# Patient Record
Sex: Female | Born: 1940 | Race: White | Hispanic: No | Marital: Married | Smoking: Never smoker
Health system: Southern US, Community
[De-identification: ages and names within clinical notes are randomized; demographics above are authoritative.]

## PROBLEM LIST (undated history)

## (undated) DIAGNOSIS — D509 Iron deficiency anemia, unspecified: Secondary | ICD-10-CM

## (undated) DIAGNOSIS — D649 Anemia, unspecified: Secondary | ICD-10-CM

## (undated) DIAGNOSIS — E785 Hyperlipidemia, unspecified: Secondary | ICD-10-CM

## (undated) DIAGNOSIS — I10 Essential (primary) hypertension: Secondary | ICD-10-CM

## (undated) DIAGNOSIS — H3530 Unspecified macular degeneration: Secondary | ICD-10-CM

## (undated) HISTORY — DX: Essential (primary) hypertension: I10

## (undated) HISTORY — PX: ABDOMINAL HYSTERECTOMY: SHX81

## (undated) HISTORY — DX: Hyperlipidemia, unspecified: E78.5

## (undated) HISTORY — DX: Anemia, unspecified: D64.9

## (undated) HISTORY — DX: Iron deficiency anemia, unspecified: D50.9

---

## 2016-05-07 DIAGNOSIS — H2513 Age-related nuclear cataract, bilateral: Secondary | ICD-10-CM | POA: Diagnosis not present

## 2016-05-07 DIAGNOSIS — H43811 Vitreous degeneration, right eye: Secondary | ICD-10-CM | POA: Diagnosis not present

## 2016-05-07 DIAGNOSIS — H353132 Nonexudative age-related macular degeneration, bilateral, intermediate dry stage: Secondary | ICD-10-CM | POA: Diagnosis not present

## 2016-06-21 DIAGNOSIS — H353190 Nonexudative age-related macular degeneration, unspecified eye, stage unspecified: Secondary | ICD-10-CM | POA: Diagnosis not present

## 2016-06-21 DIAGNOSIS — H35721 Serous detachment of retinal pigment epithelium, right eye: Secondary | ICD-10-CM | POA: Diagnosis not present

## 2016-06-21 DIAGNOSIS — H353132 Nonexudative age-related macular degeneration, bilateral, intermediate dry stage: Secondary | ICD-10-CM | POA: Diagnosis not present

## 2016-06-21 DIAGNOSIS — H2513 Age-related nuclear cataract, bilateral: Secondary | ICD-10-CM | POA: Diagnosis not present

## 2016-06-21 DIAGNOSIS — H43811 Vitreous degeneration, right eye: Secondary | ICD-10-CM | POA: Diagnosis not present

## 2016-07-31 DIAGNOSIS — J0100 Acute maxillary sinusitis, unspecified: Secondary | ICD-10-CM | POA: Diagnosis not present

## 2016-09-05 ENCOUNTER — Emergency Department (HOSPITAL_BASED_OUTPATIENT_CLINIC_OR_DEPARTMENT_OTHER)
Admission: EM | Admit: 2016-09-05 | Discharge: 2016-09-05 | Disposition: A | Discharge status: Home / Self Care | Payer: PPO | Attending: Emergency Medicine | Admitting: Emergency Medicine

## 2016-09-05 ENCOUNTER — Encounter (HOSPITAL_BASED_OUTPATIENT_CLINIC_OR_DEPARTMENT_OTHER): Payer: Self-pay | Admitting: *Deleted

## 2016-09-05 ENCOUNTER — Emergency Department (HOSPITAL_BASED_OUTPATIENT_CLINIC_OR_DEPARTMENT_OTHER): Payer: PPO

## 2016-09-05 DIAGNOSIS — R0602 Shortness of breath: Secondary | ICD-10-CM | POA: Diagnosis not present

## 2016-09-05 DIAGNOSIS — R0600 Dyspnea, unspecified: Secondary | ICD-10-CM

## 2016-09-05 DIAGNOSIS — I10 Essential (primary) hypertension: Secondary | ICD-10-CM | POA: Insufficient documentation

## 2016-09-05 DIAGNOSIS — R0789 Other chest pain: Secondary | ICD-10-CM | POA: Diagnosis not present

## 2016-09-05 DIAGNOSIS — D508 Other iron deficiency anemias: Secondary | ICD-10-CM | POA: Insufficient documentation

## 2016-09-05 DIAGNOSIS — D509 Iron deficiency anemia, unspecified: Secondary | ICD-10-CM | POA: Diagnosis not present

## 2016-09-05 HISTORY — DX: Unspecified macular degeneration: H35.30

## 2016-09-05 LAB — BASIC METABOLIC PANEL
Anion gap: 7 (ref 5–15)
BUN: 12 mg/dL (ref 6–20)
CALCIUM: 9.4 mg/dL (ref 8.9–10.3)
CO2: 28 mmol/L (ref 22–32)
Chloride: 104 mmol/L (ref 101–111)
Creatinine, Ser: 0.81 mg/dL (ref 0.44–1.00)
GFR calc Af Amer: >60 mL/min (ref >60)
GLUCOSE: 113 mg/dL — AB (ref 65–99)
POTASSIUM: 3.8 mmol/L (ref 3.5–5.1)
Sodium: 139 mmol/L (ref 135–145)

## 2016-09-05 LAB — CBC
HEMATOCRIT: 32.2 % — AB (ref 36.0–46.0)
Hemoglobin: 9.8 g/dL — ABNORMAL LOW (ref 12.0–15.0)
MCH: 21.4 pg — AB (ref 26.0–34.0)
MCHC: 30.4 g/dL (ref 30.0–36.0)
MCV: 70.3 fL — AB (ref 78.0–100.0)
PLATELETS: 290 10*3/uL (ref 150–400)
RBC: 4.58 MIL/uL (ref 3.87–5.11)
RDW: 18.7 % — AB (ref 11.5–15.5)
WBC: 5.2 10*3/uL (ref 4.0–10.5)

## 2016-09-05 LAB — TROPONIN I: Troponin I: <0.03 ng/mL (ref <0.03)

## 2016-09-05 NOTE — ED Triage Notes (Signed)
C/o chest tightness since yesterday. Also states she has been more out of breath than usual when she "runs up the stairs". States has not seen a doctor in years but last month was told her b/p was elevated when she went to an Urgent care for ear problems. States since then she has been checking her blood pressure at home and in the morning "it's high". Pt reports being very active

## 2016-09-05 NOTE — Discharge Instructions (Signed)
It was our pleasure to provide your ER care today - we hope that you feel better.  For shortness of breath/tightness, follow up with cardiologist in the coming week - see referral, or contact cardiology group of your preference, in the next day, to arrange follow up appointment.   For high blood pressure, and low blood count (hemoglobin 9.8) - follow up with primary care doctor in the coming week - discuss possibly starting blood pressure medication and iron supplement then.   Return to ER right away if worse, recurrent or persistent chest pain, increased difficulty breathing, other concern.

## 2016-09-05 NOTE — ED Provider Notes (Signed)
[facility] Provider Note   CSN: [id number] Arrival date & time: 09/05/16  [id number]     History   Chief Complaint Chief Complaint  Patient presents with  . Chest Pain    HPI Patricia Flowers is a 75 y.o. female.  Patient c/o sob, and mild chest tightness when running up the stairs in the past couple weeks. Symptoms will persist for 2-3 minutes, and resolve w rest. Denies associated nv, diaphoresis. Denies cough or uri c/o. No fever or chills. No actual chest pain, but states more of a feeling of transient sob.  Denies neck or arm pain. No leg pain or swelling. No pleuritic pain. No recent surgery, immobility, trauma or prolonged travel. No hx dvt or pe. No hx cad. +mother had cad in her 27s. Remote hx neg stress test, but no recent cardiac eval. No recent wt loss or wt gain. No hx anemia. No recent blood loss. Saw pcp last month and was told bp high - has been monitoring at home with most bps in 140's, although higher this AM.     The history is provided by the patient.  Chest Pain   Associated symptoms include shortness of breath. Pertinent negatives include no abdominal pain, no back pain, no fever, no headaches, no nausea, no palpitations and no vomiting.    Past Medical History:  Diagnosis Date  . Macular degeneration     There are no active problems to display for this patient.   Past Surgical History:  Procedure Laterality Date  . ABDOMINAL HYSTERECTOMY      OB History    No data available       Home Medications    Prior to Admission medications   Not on File    Family History No family history on file.  Social History Social History  Substance Use Topics  . Smoking status: Never Smoker  . Smokeless tobacco: Never Used  . Alcohol use No     Allergies   Shellfish allergy   Review of Systems Review of Systems  Constitutional: Negative for fever.  HENT: Negative for sore throat.   Eyes: Negative for redness.  Respiratory: Positive for  shortness of breath.   Cardiovascular: Negative for palpitations and leg swelling.  Gastrointestinal: Negative for abdominal pain, nausea and vomiting.  Genitourinary: Negative for flank pain.  Musculoskeletal: Negative for back pain and neck pain.  Skin: Negative for rash.  Neurological: Negative for headaches.  Hematological: Does not bruise/bleed easily.  Psychiatric/Behavioral: Negative for confusion.     Physical Exam Updated Vital Signs BP 176/87   Pulse 83   Resp 17   SpO2 98%   Physical Exam  Constitutional: She appears well-developed and well-nourished. No distress.  HENT:  Mouth/Throat: Oropharynx is clear and moist.  Eyes: Conjunctivae are normal. No scleral icterus.  Neck: Neck supple. No tracheal deviation present.  Cardiovascular: Normal rate, regular rhythm, normal heart sounds and intact distal pulses.  Exam reveals no gallop and no friction rub.   No murmur heard. Pulmonary/Chest: Effort normal and breath sounds normal. No respiratory distress.  Abdominal: Soft. Normal appearance. She exhibits no distension. There is no tenderness.  Musculoskeletal: She exhibits no edema.  Neurological: She is alert.  Skin: Skin is warm and dry. No rash noted. She is not diaphoretic.  Psychiatric: She has a normal mood and affect.  Nursing note and vitals reviewed.    ED Treatments / Results  Labs (all labs ordered are listed, but only abnormal results  are displayed) Results for orders placed or performed during the hospital encounter of 09/05/16  CBC  Result Value Ref Range   WBC 5.2 4.0 - 10.5 K/uL   RBC 4.58 3.87 - 5.11 MIL/uL   Hemoglobin 9.8 (L) 12.0 - 15.0 g/dL   HCT 32.2 (L) 36.0 - 46.0 %   MCV 70.3 (L) 78.0 - 100.0 fL   MCH 21.4 (L) 26.0 - 34.0 pg   MCHC 30.4 30.0 - 36.0 g/dL   RDW 18.7 (H) 11.5 - 15.5 %   Platelets 290 150 - 400 K/uL  Basic metabolic panel  Result Value Ref Range   Sodium 139 135 - 145 mmol/L   Potassium 3.8 3.5 - 5.1 mmol/L   Chloride  104 101 - 111 mmol/L   CO2 28 22 - 32 mmol/L   Glucose, Bld 113 (H) 65 - 99 mg/dL   BUN 12 6 - 20 mg/dL   Creatinine, Ser 0.81 0.44 - 1.00 mg/dL   Calcium 9.4 8.9 - 10.3 mg/dL   GFR calc non Af Amer >60 >60 mL/min   GFR calc Af Amer >60 >60 mL/min   Anion gap 7 5 - 15  Troponin I  Result Value Ref Range   Troponin I <0.03 <0.03 ng/mL   Dg Chest 2 View  Result Date: 09/05/2016 CLINICAL DATA:  Shortness of breath, intermittent for 2 weeks EXAM: CHEST  2 VIEW COMPARISON:  None. FINDINGS: Lungs are clear. Heart size and pulmonary vascularity are normal. No adenopathy. There are calcified breast implants bilaterally. 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 IMPRESSION: No edema or consolidation. Electronically Signed   By: Lowella Grip III M.D.   On: 09/05/2016 09:40    EKG  EKG Interpretation  Date/Time:  Wednesday September 05 2016 08:33:54 EST Ventricular Rate:  87 PR Interval:    QRS Duration: 100 QT Interval:  395 QTC Calculation: 476 R Axis:   62 Text Interpretation:  Sinus rhythm Baseline wander in lead(s) II III aVF V2 V5 V6 No previous tracing Confirmed by Ashok Cordia  MD, Lennette Bihari ([id number]) on 09/05/2016 8:50:21 AM       Radiology Dg Chest 2 View  Result Date: 09/05/2016 CLINICAL DATA:  Shortness of breath, intermittent for 2 weeks EXAM: CHEST  2 VIEW COMPARISON:  None. FINDINGS: Lungs are clear. Heart size and pulmonary vascularity are normal. No adenopathy. There are calcified breast implants bilaterally. 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 IMPRESSION: No edema or consolidation. Electronically Signed   By: Lowella Grip III M.D.   On: 09/05/2016 09:40    Procedures Procedures (including critical care time)  Medications Ordered in ED Medications - No data to display   Initial Impression / Assessment and Plan / ED Course  I have reviewed the triage vital signs and the nursing notes.  Pertinent labs & imaging results that were available during my care of the patient  were reviewed by me and considered in my medical decision making (see chart for details).  Clinical Course    Ecg. Labs. Cxr.  Reviewed nursing notes and prior charts for additional history.   Recheck pt, completely asymptomatic. No chest pain or discomfort. No sob.  Discussed labs, including microcytic anemia w pt - no recent blood loss, no melena - will f/u pcp, discussed possible fe rx.   For transient dyspnea/tightness, and fam hx cad, will have f/u cardiology as outpatient.   Pt remains asymptomatic, and currently appears stable for d/c.    Final Clinical Impressions(s) / ED Diagnoses  Final diagnoses:  None    New Prescriptions New Prescriptions   No medications on file     Lajean Saver, MD 09/05/16 1055

## 2016-09-05 NOTE — ED Notes (Signed)
Patient transported to X-ray 

## 2016-09-12 ENCOUNTER — Telehealth: Payer: Self-pay | Admitting: Behavioral Health

## 2016-09-12 ENCOUNTER — Encounter: Payer: Self-pay | Admitting: Behavioral Health

## 2016-09-12 NOTE — Telephone Encounter (Signed)
Pre-Visit Call completed with patient and chart updated.   Pre-Visit Info documented in Specialty Comments under [employer].    

## 2016-09-13 ENCOUNTER — Ambulatory Visit (INDEPENDENT_AMBULATORY_CARE_PROVIDER_SITE_OTHER): Payer: PPO | Admitting: Family Medicine

## 2016-09-13 ENCOUNTER — Encounter: Payer: Self-pay | Admitting: Family Medicine

## 2016-09-13 VITALS — BP 154/80 | HR 97 | Temp 98.9°F | Ht 62.0 in | Wt 123.0 lb

## 2016-09-13 DIAGNOSIS — I10 Essential (primary) hypertension: Secondary | ICD-10-CM | POA: Diagnosis not present

## 2016-09-13 DIAGNOSIS — D509 Iron deficiency anemia, unspecified: Secondary | ICD-10-CM | POA: Diagnosis not present

## 2016-09-13 DIAGNOSIS — Z13220 Encounter for screening for lipoid disorders: Secondary | ICD-10-CM | POA: Diagnosis not present

## 2016-09-13 DIAGNOSIS — Z1231 Encounter for screening mammogram for malignant neoplasm of breast: Secondary | ICD-10-CM

## 2016-09-13 DIAGNOSIS — Z1239 Encounter for other screening for malignant neoplasm of breast: Secondary | ICD-10-CM

## 2016-09-13 LAB — LIPID PANEL
CHOLESTEROL: 210 mg/dL — AB (ref 0–200)
HDL: 78.4 mg/dL (ref >39.00)
LDL CALC: 112 mg/dL — AB (ref 0–99)
NONHDL: 132.05
Total CHOL/HDL Ratio: 3
Triglycerides: 100 mg/dL (ref 0.0–149.0)
VLDL: 20 mg/dL (ref 0.0–40.0)

## 2016-09-13 LAB — FERRITIN: Ferritin: 7.2 ng/mL — ABNORMAL LOW (ref 10.0–291.0)

## 2016-09-13 LAB — CBC
MCHC: 30.9 g/dL (ref 30.0–36.0)
MCV: 69.7 fl — ABNORMAL LOW (ref 78.0–100.0)
Platelets: 314 10*3/uL (ref 150.0–400.0)
RBC: 4.45 Mil/uL (ref 3.87–5.11)
RDW: 19.2 % — ABNORMAL HIGH (ref 11.5–15.5)
WBC: 5.5 10*3/uL (ref 4.0–10.5)

## 2016-09-13 LAB — IBC PANEL
Iron: 13 ug/dL — ABNORMAL LOW (ref 42–145)
Saturation Ratios: 3 % — ABNORMAL LOW (ref 20.0–50.0)
Transferrin: 311 mg/dL (ref 212.0–360.0)

## 2016-09-13 NOTE — Progress Notes (Signed)
Chief Complaint  Patient presents with  . Establish Care       New Patient Visit SUBJECTIVE: HPI: Patricia Flowers is an 75 y.o.female who is being seen for establishing care.  Hypertension Patient presents for hypertension follow up. She does monitor home blood pressures. Blood pressures ranging on average from 150's/80's. She is not on any medications. She is adhering to a healthy diet overall. Exercise: walking, pickleball, [facility]  Anemia Was told her blood count is low in the ED on 11/29. New to her. Denies areas of easy bleeding, she does bruise easier as she gets older, no blood in stool or dark tarry stools. She is not on ASA or NSAIDs routinely. Not on any anti-coagulation. Her last colonoscopy was 8 years ago and they removed polyps. Per her, she is to follow up after 10 years.    Allergies  Allergen Reactions  . Shellfish Allergy Swelling    Past Medical History:  Diagnosis Date  . Anemia   . Hyperlipidemia   . Hypertension   . Macular degeneration    Past Surgical History:  Procedure Laterality Date  . ABDOMINAL HYSTERECTOMY     Social History   Social History  . Marital status: Married   Social History Main Topics  . Smoking status: Never Smoker  . Smokeless tobacco: Never Used  . Alcohol use No  . Drug use: No   Family History  Problem Relation Age of Onset  . Heart disease Mother      Current Outpatient Prescriptions:  .  Ascorbic Acid (VITAMIN C) 1000 MG tablet, Take 1,000 mg by mouth daily., Disp: , Rfl:  .  Biotin 10000 MCG TABS, Take by mouth., Disp: , Rfl:  .  Coenzyme Q10 (COQ-10) 100 MG CAPS, Take by mouth., Disp: , Rfl:  .  folic acid (FOLVITE) Q000111Q MCG tablet, Take 400 mcg by mouth daily., Disp: , Rfl:  .  Krill Oil (MAXIMUM RED KRILL PO), Take 500 mg by mouth., Disp: , Rfl:  .  TURMERIC PO, Take 600 mg by mouth., Disp: , Rfl:  .  vitamin B-12 (CYANOCOBALAMIN) 1000 MCG tablet, Take 1,000 mcg by mouth 3 (three) times a  week., Disp: , Rfl:  .  vitamin E 1000 UNIT capsule, Take 1,000 Units by mouth every other day., Disp: , Rfl:   No LMP recorded. Patient has had a hysterectomy.  ROS Cardiovascular: Denies chest pain  Respiratory: Denies dyspnea   OBJECTIVE: BP (!) 154/80   Pulse 97   Temp 98.9 F (37.2 C) (Oral)   Ht 5\' 2"  (1.575 m)   Wt 123 lb (55.8 kg)   SpO2 100%   BMI 22.50 kg/m   Constitutional: -  VS reviewed -  Well developed, well nourished, appears stated age -  No apparent distress  Psychiatric: -  Oriented to person, place, and time -  Memory intact -  Affect and mood normal -  Fluent conversation, good eye contact -  Judgment and insight age appropriate  Eye: -  Conjunctivae clear, no discharge -  Pupils symmetric, round, reactive to light  ENMT: -  Ears are patent b/l without erythema or discharge. TM's are shiny and clear b/l without evidence of effusion or infection. -  Oral mucosa without lesions, tongue and uvula midline    Tonsils not enlarged, no erythema, no exudate, trachea midline    Pharynx moist, no lesions, no erythema  Neck: -  No gross swelling, no palpable masses -  Thyroid midline, not enlarged, mobile, no palpable masses  Cardiovascular: -  RRR, no murmurs -  No LE edema  Respiratory: -  Normal respiratory effort, no accessory muscle use, no retraction -  Breath sounds equal, no wheezes, no ronchi, no crackles  Gastrointestinal: -  Bowel sounds normal -  No tenderness, no distention, no guarding, no masses  Skin: -  No significant lesion on inspection -  Warm and dry to palpation   ASSESSMENT/PLAN: Microcytic anemia - Plan: Save smear, IBC panel, Ferritin, CBC  Essential hypertension  Breast cancer screening - Plan: MM DIGITAL SCREENING BILATERAL  Screening cholesterol level - Plan: Lipid panel  Orders as above. OK with seeing if she can lower it on her own in the next month. Recommended tetanus shot today, but she declined stating that she will  think about it. Patient should return pending the above workup. If it is Fe def anemia, I will refer her to GI after placing her on a supplement.  The patient voiced understanding and agreement to the plan.   [place], DO 09/13/16  11:00 AM

## 2016-09-13 NOTE — Progress Notes (Signed)
Pre visit review using our clinic tool,if applicable. No additional management support is needed unless otherwise documented below in the visit note.  

## 2016-09-13 NOTE — Patient Instructions (Signed)
Someone will call you to schedule your next appointment based on your lab results.  Someone will call you to set up your mammogram.

## 2016-09-17 ENCOUNTER — Ambulatory Visit (HOSPITAL_BASED_OUTPATIENT_CLINIC_OR_DEPARTMENT_OTHER)
Admission: RE | Admit: 2016-09-17 | Discharge: 2016-09-17 | Disposition: A | Discharge status: Home / Self Care | Payer: PPO | Source: Ambulatory Visit | Attending: Family Medicine | Admitting: Family Medicine

## 2016-09-17 ENCOUNTER — Ambulatory Visit (INDEPENDENT_AMBULATORY_CARE_PROVIDER_SITE_OTHER): Payer: PPO | Admitting: Family Medicine

## 2016-09-17 ENCOUNTER — Encounter: Payer: Self-pay | Admitting: Family Medicine

## 2016-09-17 ENCOUNTER — Encounter: Payer: Self-pay | Admitting: Physician Assistant

## 2016-09-17 VITALS — BP 152/68 | HR 104 | Temp 98.0°F | Ht 62.0 in | Wt 123.0 lb

## 2016-09-17 DIAGNOSIS — Z9189 Other specified personal risk factors, not elsewhere classified: Secondary | ICD-10-CM | POA: Diagnosis not present

## 2016-09-17 DIAGNOSIS — Z1231 Encounter for screening mammogram for malignant neoplasm of breast: Secondary | ICD-10-CM | POA: Insufficient documentation

## 2016-09-17 DIAGNOSIS — Z1239 Encounter for other screening for malignant neoplasm of breast: Secondary | ICD-10-CM

## 2016-09-17 DIAGNOSIS — D509 Iron deficiency anemia, unspecified: Secondary | ICD-10-CM

## 2016-09-17 NOTE — Patient Instructions (Signed)
Take Ferrous Sulfate over the counter. Three times daily. It may turn your stool black. It may also cause constipation- some have success with Colace to offset this.

## 2016-09-17 NOTE — Progress Notes (Signed)
Pre visit review using our clinic review tool, if applicable. No additional management support is needed unless otherwise documented below in the visit note. 

## 2016-09-17 NOTE — Progress Notes (Signed)
Chief Complaint  Patient presents with  . Follow-up    labs    Subjective: Patient is a 75 y.o. female here for lab f/u.  10 yr CVD is >10%. She exercises routinely and has a healthy diet. Her BP is not well controlled, but she is trying to manage it using OTC supplements and vitamins. She is not currently on a statin.  Iron deficiency anemia The patient's iron deficiency anemia has been confirmed. She notes that she has been having hair loss, a proclivity to eat ice, and intermittent shortness of breath when she exerts herself. Does not take an iron supplement. She denies any areas of easy bruising or bleeding. No dark or tarry stools, no blood in the stool, no blood in the urine, and no yellowing of the skin or eyes.  ROS: Heme: No areas of easy bruising or bleeding GI: No constipation or diarrhea, no dark tarry stools or bleeding  Family History  Problem Relation Age of Onset  . Heart disease Mother    Past Medical History:  Diagnosis Date  . Anemia   . Hyperlipidemia   . Hypertension   . Macular degeneration    Allergies  Allergen Reactions  . Iodine Itching and Swelling  . Shellfish Allergy Swelling    Current Outpatient Prescriptions:  .  Ascorbic Acid (VITAMIN C) 1000 MG tablet, Take 1,000 mg by mouth daily., Disp: , Rfl:  .  Biotin 10000 MCG TABS, Take by mouth., Disp: , Rfl:  .  Coenzyme Q10 (COQ-10) 100 MG CAPS, Take by mouth., Disp: , Rfl:  .  folic acid (FOLVITE) Q000111Q MCG tablet, Take 400 mcg by mouth daily., Disp: , Rfl:  .  Krill Oil (MAXIMUM RED KRILL PO), Take 500 mg by mouth., Disp: , Rfl:  .  TURMERIC PO, Take 600 mg by mouth., Disp: , Rfl:  .  vitamin B-12 (CYANOCOBALAMIN) 1000 MCG tablet, Take 1,000 mcg by mouth 3 (three) times a week., Disp: , Rfl:  .  vitamin E 1000 UNIT capsule, Take 1,000 Units by mouth every other day., Disp: , Rfl:   Objective: BP (!) 152/68 (BP Location: Left Arm, Patient Position: Sitting, Cuff Size: Small)   Pulse (!) 104    Temp 98 F (36.7 C) (Oral)   Ht 5\' 2"  (1.575 m)   Wt 123 lb (55.8 kg)   SpO2 98%   BMI 22.50 kg/m  General: Awake, appears stated age HEENT: MMM, no subglossal icterus, no scleral icterus, PERRLA Neck: Supple, no masses, no thyromegaly Heart: RRR, no murmurs, no bruits Lungs: CTAB, no rales, wheezes or rhonchi. No accessory muscle use Abd: BS+, soft, NT, ND, no masses or organomegaly Psych: Age appropriate judgment and insight, normal affect and mood  Assessment and Plan: Iron deficiency anemia, unspecified iron deficiency anemia type - Plan: Ambulatory referral to Gastroenterology  10 year risk of MI or stroke 7.5% or greater  Orders as above. Start taking supplemental iron 3 times daily. Can use Colace or MiraLAX to help offset constipation. She is warned that it will turn her stools black. GI to rule out sites of bleeding. If this is unremarkable, will obtain a urine sample. She does not wish to start anything for her increased 10 years CVD risk. I would like her to try to get her blood pressure under control. I will recheck this in 4 weeks. If still elevated, will recommend starting treatment. The patient voiced understanding and agreement to the plan.  [place], DO 09/17/16  1:35 PM

## 2016-09-24 ENCOUNTER — Ambulatory Visit (INDEPENDENT_AMBULATORY_CARE_PROVIDER_SITE_OTHER): Payer: PPO | Admitting: Physician Assistant

## 2016-09-24 ENCOUNTER — Encounter: Payer: Self-pay | Admitting: Physician Assistant

## 2016-09-24 VITALS — BP 160/80 | HR 88 | Ht 62.0 in | Wt 122.8 lb

## 2016-09-24 DIAGNOSIS — K5909 Other constipation: Secondary | ICD-10-CM

## 2016-09-24 DIAGNOSIS — D509 Iron deficiency anemia, unspecified: Secondary | ICD-10-CM | POA: Diagnosis not present

## 2016-09-24 MED ORDER — NA SULFATE-K SULFATE-MG SULF 17.5-3.13-1.6 GM/177ML PO SOLN: 1.0000 | ORAL | 0 refills | Status: AC

## 2016-09-24 NOTE — Progress Notes (Signed)
Agree with assessment and plan. She should avoid all NSAIDs in the interim, would add PPI if she has any upper tract symptoms - does not endorse this per note.

## 2016-09-24 NOTE — Patient Instructions (Signed)

## 2016-09-24 NOTE — Progress Notes (Signed)
Chief Complaint: IDA  HPI:  Patricia Flowers is a 75 year old Caucasian female with a past medical history of hyperlipidemia and hypertension, who was referred to me by Shelda Pal* for a complaint of iron deficiency anemia. Patient had a recent CBC done in 09/05/16 with a hemoglobin of 9.8 and MCV low at 70.3. Iron panel on 09/13/2016 showed an iron low at 13 and percent saturation low at 3%, ferritin was also low at 7.2. Repeat CBC on 09/13/2016 showed a hemoglobin of 9.6.   Today, the patient tells me that she is generally well. She describes that she had not been to the doctor over the past 20 years until 2 months ago when she had loss of hearing in 1 ear, which turned out to be a cerumen impaction. At that time she was told she had high blood pressure and then ended up in the ER a couple of weeks later with chest pain that she experienced momentarily, cardiac workup was negative and she was told she needed a PCP. He was doing labs and discovered that her hemoglobin and iron were low as above. The patient tells me that she tries to avoid doctors and prescribed medications. She lives off of a lot of supplements which help her with her constipation. Patient does tell me that if she runs up the steps she will become short of breath, but only for a slight moment. She cannot tell me if this is a difference over the past couple of months. Patient has described that in general she feels somewhat more fatigued than normal, but blames this on her increased level of activity. Patient was started on iron supplements and feels better after starting them. She is leery to have any procedures done as she is now 75 years old.   Past medical history is positive for a colonoscopy approximately 12 years ago per the patient in [facility] where there is a finding of polyps. She never had another one.   She denies fever, chills, blood in her stool, melena, change in bowel habits, weight loss, anorexia, nausea,  vomiting, heartburn, reflux or abdominal pain.  Past Medical History:  Diagnosis Date  . Anemia   . Hyperlipidemia   . Hypertension   . Macular degeneration     Past Surgical History:  Procedure Laterality Date  . ABDOMINAL HYSTERECTOMY      Current Outpatient Prescriptions  Medication Sig Dispense Refill  . AMBULATORY NON FORMULARY MEDICATION NattoPlus , one capsule twice daily for blood pressure    . AMBULATORY NON FORMULARY MEDICATION Systolex tablets , one tablet twice daily for blood pressure    . AMBULATORY NON FORMULARY MEDICATION Takes [place] yoga and the following as well  Coconut oil, honey, cinnamon, vinegar, beet juice    . Ascorbic Acid (VITAMIN C) 1000 MG tablet Take 1,000 mg by mouth daily.    . Biotin 10000 MCG TABS Take by mouth.    . Coenzyme Q10 (COQ-10) 100 MG CAPS Take by mouth.    . ferrous sulfate 325 (65 FE) MG tablet Take 325 mg by mouth 3 (three) times daily with meals.    . folic acid (FOLVITE) 366 MCG tablet Take 400 mcg by mouth daily.    Javier Docker Oil (MAXIMUM RED KRILL PO) Take 500 mg by mouth.    . Multiple Vitamins-Minerals (PRESERVISION AREDS 2) CAPS Take 2 capsules by mouth daily.    . TURMERIC PO Take 600 mg by mouth.    . vitamin B-12 (  CYANOCOBALAMIN) 1000 MCG tablet Take 1,000 mcg by mouth 3 (three) times a week.    . vitamin E 1000 UNIT capsule Take 1,000 Units by mouth every other day.    . Na Sulfate-K Sulfate-Mg Sulf 17.5-3.13-1.6 GM/180ML SOLN Take 1 kit by mouth as directed. 354 mL 0   No current facility-administered medications for this visit.     Allergies as of 09/24/2016 - Review Complete 09/24/2016  Allergen Reaction Noted  . Iodine Itching and Swelling 03/20/2012  . Shellfish allergy Swelling 09/05/2016    Family History  Problem Relation Age of Onset  . Heart disease Mother   . Colon cancer Neg Hx     Social History   Social History  . Marital status: Married    Spouse name: N/A  . Number of children: 2  . Years  of education: N/A   Occupational History  . retired    Social History Main Topics  . Smoking status: Never Smoker  . Smokeless tobacco: Never Used  . Alcohol use No  . Drug use: No  . Sexual activity: Not on file   Other Topics Concern  . Not on file   Social History Narrative  . No narrative on file    Review of Systems:    Constitutional: Positive for fatigue No weight loss, fever, chills, weakness or fatigue HEENT: Eyes: No change in vision               Ears, Nose, Throat:  No change in hearing or congestion Cardiovascular: No chest pain Respiratory: Has referred DOB No cough Gastrointestinal: See HPI and otherwise negative Genitourinary: No dysuria  Neurological: No headache, dizziness or syncope Musculoskeletal: No new muscle or joint pain Hematologic: No bleeding or bruising Psychiatric: No history of depression or anxiety   Physical Exam:  Vital signs: BP (!) 160/80   Pulse 88   Ht 5' 2"  (1.575 m)   Wt 122 lb 12.8 oz (55.7 kg)   BMI 22.46 kg/m   Constitutional:   Pleasant Elderly Caucasian female appears to be in NAD, Well developed, Well nourished, alert and cooperative Head:  Normocephalic and atraumatic. Eyes:   PEERL, EOMI. No icterus. Conjunctiva pink. Ears:  Normal auditory acuity. Neck:  Supple Throat: Oral cavity and pharynx without inflammation, swelling or lesion.  Respiratory: Respirations even and unlabored. Lungs clear to auscultation bilaterally.   No wheezes, crackles, or rhonchi.  Cardiovascular: Normal S1, S2. No MRG. Regular rate and rhythm. No peripheral edema, cyanosis or pallor.  Gastrointestinal:  Soft, nondistended, nontender. No rebound or guarding. Normal bowel sounds. No appreciable masses or hepatomegaly. Rectal:  Not performed.  Msk:  Symmetrical without gross deformities. Without edema, no deformity or joint abnormality.  Neurologic:  Alert and  oriented x4;  grossly normal neurologically.  Skin:   Dry and intact without  significant lesions or rashes. Psychiatric:  Demonstrates good judgement and reason without abnormal affect or behaviors.  RELEVANT LABS AND IMAGING: CBC    Component Value Date/Time   WBC 5.5 09/13/2016 1102   RBC 4.45 09/13/2016 1102   HGB 9.6 Repeated and verified X2. (L) 09/13/2016 1102   HCT 31.0 Repeated and verified X2. (L) 09/13/2016 1102   PLT 314.0 09/13/2016 1102   MCV 69.7 Repeated and verified X2. (L) 09/13/2016 1102   MCH 21.4 (L) 09/05/2016 0850   MCHC 30.9 09/13/2016 1102   RDW 19.2 (H) 09/13/2016 1102    CMP     Component Value Date/Time   NA  139 09/05/2016 0850   K 3.8 09/05/2016 0850   CL 104 09/05/2016 0850   CO2 28 09/05/2016 0850   GLUCOSE 113 (H) 09/05/2016 0850   BUN 12 09/05/2016 0850   CREATININE 0.81 09/05/2016 0850   CALCIUM 9.4 09/05/2016 0850   GFRNONAA >60 09/05/2016 0850   GFRAA >60 09/05/2016 0850    Assessment: 1. IDA: Patient was found to be iron deficient in early November, continues to be anemic on iron supplementation, no GI symptoms; consider upper versus lower source of GI blood loss versus other 2. Constipation: Patient describes this is controlled with at home remedies of magnesium and coconut oil  Plan: 1. Recommend the patient proceed with an EGD and colonoscopy for further evaluation. This is scheduled with Dr. Havery Moros and [facility]. We did spend a long time discussing the risks and benefits of this procedure and the patient agrees to move forward. 2. Recommend the patient continue her iron supplementation as previously prescribed 3. Patient to follow in clinic per Dr. Doyne Keel recommendations after time procedures.  Ellouise Newer, PA-C [facility] 09/24/2016, 1:12 PM  Cc: Shelda Pal*

## 2016-10-03 ENCOUNTER — Telehealth: Payer: Self-pay | Admitting: Family Medicine

## 2016-10-03 NOTE — Telephone Encounter (Signed)
Relation to [id number] Call back number:[phone]   Reason for call:   Patient last seen 09/17/16 and states PCP advised as per [id number] Ferrous Sulfate patient states unsure if the OTC is causing patient to feel [place], loss appetite and weight loss, please advise

## 2016-10-03 NOTE — Telephone Encounter (Signed)
Please advise for recommendation. TL/CMA

## 2016-10-03 NOTE — Telephone Encounter (Signed)
OK to cut down on frequency or stop altogether and see if that helps.

## 2016-10-04 ENCOUNTER — Encounter: Payer: Self-pay | Admitting: Family Medicine

## 2016-10-04 ENCOUNTER — Telehealth: Payer: Self-pay | Admitting: Gastroenterology

## 2016-10-04 ENCOUNTER — Ambulatory Visit (INDEPENDENT_AMBULATORY_CARE_PROVIDER_SITE_OTHER): Payer: PPO | Admitting: Family Medicine

## 2016-10-04 VITALS — BP 149/79 | HR 97 | Temp 98.5°F | Ht 62.0 in | Wt 118.8 lb

## 2016-10-04 DIAGNOSIS — R634 Abnormal weight loss: Secondary | ICD-10-CM

## 2016-10-04 DIAGNOSIS — R110 Nausea: Secondary | ICD-10-CM

## 2016-10-04 DIAGNOSIS — D509 Iron deficiency anemia, unspecified: Secondary | ICD-10-CM

## 2016-10-04 DIAGNOSIS — Z888 Allergy status to other drugs, medicaments and biological substances status: Secondary | ICD-10-CM | POA: Diagnosis not present

## 2016-10-04 HISTORY — DX: Iron deficiency anemia, unspecified: D50.9

## 2016-10-04 LAB — CBC WITH DIFFERENTIAL/PLATELET
BASOS ABS: 0 10*3/uL (ref 0.0–0.1)
Basophils Relative: 0.1 % (ref 0.0–3.0)
EOS ABS: 0 10*3/uL (ref 0.0–0.7)
Eosinophils Relative: 0.6 % (ref 0.0–5.0)
HEMATOCRIT: 38.3 % (ref 36.0–46.0)
Hemoglobin: 12.3 g/dL (ref 12.0–15.0)
LYMPHS ABS: 1.2 10*3/uL (ref 0.7–4.0)
LYMPHS PCT: 19.4 % (ref 12.0–46.0)
MCHC: 32.1 g/dL (ref 30.0–36.0)
MCV: 76.2 fl — ABNORMAL LOW (ref 78.0–100.0)
Monocytes Absolute: 0.3 10*3/uL (ref 0.1–1.0)
Monocytes Relative: 5.3 % (ref 3.0–12.0)
NEUTROS ABS: 4.7 10*3/uL (ref 1.4–7.7)
NEUTROS PCT: 74.6 % (ref 43.0–77.0)
PLATELETS: 319 10*3/uL (ref 150.0–400.0)
RBC: 5.02 Mil/uL (ref 3.87–5.11)
RDW: 27.6 % — ABNORMAL HIGH (ref 11.5–15.5)
WBC: 6.2 10*3/uL (ref 4.0–10.5)

## 2016-10-04 LAB — COMPREHENSIVE METABOLIC PANEL
ALT: 10 U/L (ref 0–35)
AST: 16 U/L (ref 0–37)
Albumin: 4.2 g/dL (ref 3.5–5.2)
Alkaline Phosphatase: 89 U/L (ref 39–117)
BILIRUBIN TOTAL: 0.4 mg/dL (ref 0.2–1.2)
BUN: 12 mg/dL (ref 6–23)
CO2: 32 meq/L (ref 19–32)
CREATININE: 0.86 mg/dL (ref 0.40–1.20)
Calcium: 9.6 mg/dL (ref 8.4–10.5)
Chloride: 101 mEq/L (ref 96–112)
GFR: 68.38 mL/min (ref >60.00)
GLUCOSE: 116 mg/dL — AB (ref 70–99)
Potassium: 4.2 mEq/L (ref 3.5–5.1)
Sodium: 139 mEq/L (ref 135–145)
Total Protein: 7 g/dL (ref 6.0–8.3)

## 2016-10-04 MED ORDER — PREDNISONE 50 MG PO TABS: ORAL_TABLET | ORAL | 0 refills | Status: DC

## 2016-10-04 MED FILL — predniSONE 50 MG TABS: 50 | 2 days supply | Qty: 3 | Fill #0

## 2016-10-04 NOTE — Progress Notes (Signed)
Pre visit review using our clinic tool,if applicable. No additional management support is needed unless otherwise documented below in the visit note.  

## 2016-10-04 NOTE — Progress Notes (Signed)
Chief Complaint  Patient presents with  . Weight Loss    2-3 weeks worsening    Subjective: Patient is a 74 y.o. female here for weight loss. Here with husband.  Experiencing loss of appetite, some nausea, fatigue/weakness, hair loss, and cold hands/feet. Also brought up strong odor with urine and occasional depression.   She does have a hx of Fe def anemia. She has a EGD and colonoscopy in 2018, February.  2-3 weeks ago started. She's not had any appetite has been forcing herself to eat. She will have a low-level nausea and early satiety. She feels a pressure in her upper abdominal region, particularly when she eats. She's not having any vomiting, diarrhea, constipation, blood in stool, trouble swallowing or urinary complaints. She is taking some oral iron and notices dark tarry stools. This was not present prior to beginning this therapy.  ROS: GI: +nausea Const: +weight loss  Family History  Problem Relation Age of Onset  . Heart disease Mother   . Colon cancer Neg Hx    Past Medical History:  Diagnosis Date  . Anemia   . Hyperlipidemia   . Hypertension   . Iron deficiency anemia 10/04/2016  . Macular degeneration    Allergies  Allergen Reactions  . Iodine Itching and Swelling  . Shellfish Allergy Swelling    Occurred when fishing and touched raw shrimp    Current Outpatient Prescriptions:  .  AMBULATORY NON FORMULARY MEDICATION, NattoPlus , one capsule twice daily for blood pressure, Disp: , Rfl:  .  AMBULATORY NON FORMULARY MEDICATION, Systolex tablets , one tablet twice daily for blood pressure, Disp: , Rfl:  .  AMBULATORY NON FORMULARY MEDICATION, Takes activia yoga and the following as well  Coconut oil, honey, cinnamon, vinegar, beet juice, Disp: , Rfl:  .  Ascorbic Acid (VITAMIN C) 1000 MG tablet, Take 1,000 mg by mouth daily., Disp: , Rfl:  .  Biotin 10000 MCG TABS, Take by mouth., Disp: , Rfl:  .  Coenzyme Q10 (COQ-10) 100 MG CAPS, Take by mouth., Disp: , Rfl:   .  ferrous sulfate 325 (65 FE) MG tablet, Take 325 mg by mouth 3 (three) times daily with meals., Disp: , Rfl:  .  folic acid (FOLVITE) 092 MCG tablet, Take 400 mcg by mouth daily., Disp: , Rfl:  .  Krill Oil (MAXIMUM RED KRILL PO), Take 500 mg by mouth., Disp: , Rfl:  .  Multiple Vitamins-Minerals (PRESERVISION AREDS 2) CAPS, Take 2 capsules by mouth daily., Disp: , Rfl:  .  Na Sulfate-K Sulfate-Mg Sulf 17.5-3.13-1.6 GM/180ML SOLN, Take 1 kit by mouth as directed., Disp: 354 mL, Rfl: 0 .  TURMERIC PO, Take 600 mg by mouth., Disp: , Rfl:  .  vitamin B-12 (CYANOCOBALAMIN) 1000 MCG tablet, Take 1,000 mcg by mouth 3 (three) times a week., Disp: , Rfl:  .  vitamin E 1000 UNIT capsule, Take 1,000 Units by mouth every other day., Disp: , Rfl:  .  predniSONE (DELTASONE) 50 MG tablet, Take 1 tab at 10 PM. Take next tab at 4 AM. Take the last tab at 10 AM., Disp: 3 tablet, Rfl: 0  Objective: BP (!) 149/79   Pulse 97   Temp 98.5 F (36.9 C) (Oral)   Ht _0  (1.575 m)   Wt 118 lb 12.8 oz (53.9 kg)   SpO2 97%   BMI 21.73 kg/m  General: Awake, appears stated age HEENT: MMM, EOMi, no oral ulcers Heart: RRR, no murmurs Lungs: CTAB, no  rales, wheezes or rhonchi. 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 Prednisone 50 mg, 1 tab at 10 PM, 4 AM and 10 AM starting tonight. Benadryl 1 hr prior to scan. Her imaging study is scheduled for 11 AM on 10/05/16. CT to r/o obstruction/mass, given hx of anemia and sudden change in symptoms. Labs to r/o  worsening anemia. Contact GI and see if she can get in sooner for scopes. Signs and symptoms have changed since her initial consult.  F/u pending the above. The patient and husband voiced understanding and agreement to the plan.  [place], DO 10/04/16  2:26 PM

## 2016-10-04 NOTE — Telephone Encounter (Signed)
Spoke to patient to let her know that Dr. Havery Moros will be out of the office a few days, will return next Tuesday. We will look at the schedule to see about working her in sooner and get back to her.

## 2016-10-05 ENCOUNTER — Encounter (HOSPITAL_BASED_OUTPATIENT_CLINIC_OR_DEPARTMENT_OTHER): Payer: Self-pay

## 2016-10-05 ENCOUNTER — Telehealth: Payer: Self-pay | Admitting: Family Medicine

## 2016-10-05 ENCOUNTER — Ambulatory Visit (HOSPITAL_BASED_OUTPATIENT_CLINIC_OR_DEPARTMENT_OTHER)
Admission: RE | Admit: 2016-10-05 | Discharge: 2016-10-05 | Disposition: A | Discharge status: Home / Self Care | Payer: PPO | Source: Ambulatory Visit | Attending: Family Medicine | Admitting: Family Medicine

## 2016-10-05 DIAGNOSIS — K5730 Diverticulosis of large intestine without perforation or abscess without bleeding: Secondary | ICD-10-CM | POA: Diagnosis not present

## 2016-10-05 DIAGNOSIS — D509 Iron deficiency anemia, unspecified: Secondary | ICD-10-CM | POA: Insufficient documentation

## 2016-10-05 DIAGNOSIS — K5790 Diverticulosis of intestine, part unspecified, without perforation or abscess without bleeding: Secondary | ICD-10-CM | POA: Diagnosis not present

## 2016-10-05 DIAGNOSIS — R634 Abnormal weight loss: Secondary | ICD-10-CM | POA: Insufficient documentation

## 2016-10-05 DIAGNOSIS — R110 Nausea: Secondary | ICD-10-CM | POA: Insufficient documentation

## 2016-10-05 DIAGNOSIS — Z90710 Acquired absence of both cervix and uterus: Secondary | ICD-10-CM | POA: Diagnosis not present

## 2016-10-05 MED ORDER — IOPAMIDOL (ISOVUE-300) INJECTION 61%
100.0000 mL | Freq: Once | INTRAVENOUS | Status: AC | PRN
  Administered 2016-10-05: 100 mL via INTRAVENOUS

## 2016-10-05 NOTE — Telephone Encounter (Signed)
Caller name: Reisa Relation to pt: SELF Call back number: [phone] call this number since they are with this cell phone) or [phone] Pharmacy:  Reason for call: Pt came in office stating was seen at our office yesterday 10-04-16 and PCP informed pt to verify with her Gastroenterology and see if they can see her soon (by making an early appt) and if they could not see her early pt stated that provider wanted to give [place] an up date so he can see with he can do for her. (Trying to get the pt an earlier appt with Gertie Fey). Please advise ASAP.

## 2016-10-05 NOTE — Telephone Encounter (Signed)
Spoke with pt and informed the below, pt understood and stated if possible to call her with her CT Scan results ASAP.

## 2016-10-05 NOTE — Telephone Encounter (Signed)
I will have our office reach out to see if anything can be done after the results of her CT scan come back. All we can do is ask though, as it is their discretion whether they will allow it.

## 2016-10-05 NOTE — Telephone Encounter (Signed)
Tried to contact pt to discuss provider recommendations. No answer, left message with this information for pt.

## 2016-10-09 ENCOUNTER — Telehealth: Payer: Self-pay | Admitting: Emergency Medicine

## 2016-10-09 NOTE — Telephone Encounter (Signed)
Called pt to inform of results on 10/05/16.

## 2016-10-09 NOTE — Telephone Encounter (Signed)
Pt was called on 10/05/16 with results.

## 2016-10-09 NOTE — Telephone Encounter (Signed)
I agree, the sooner the better to get her case done. Do we have any openings for a double procedure prior to her scheduled appointment in late February? If not, if we have any single openings in an AM session, if okay with endo I could add a double one of those days and start at 0730 AM. Thanks

## 2016-10-10 ENCOUNTER — Telehealth: Payer: Self-pay | Admitting: Gastroenterology

## 2016-10-10 NOTE — Telephone Encounter (Signed)
Spoke to patient, she is rescheduled to 11/05/16. Patient instructed to adjust her prep instructions accordingly.

## 2016-10-10 NOTE — Telephone Encounter (Signed)
There are no single AM openings, on 11/05/16 there are three scattered openings throughout the day, not sure if that will help.

## 2016-10-10 NOTE — Telephone Encounter (Signed)
Yes you can put her on 1/29 in the afternoon there is a double. If we have openings in the interim we can contact her to get in for it. Thanks much

## 2016-10-23 DIAGNOSIS — Z90710 Acquired absence of both cervix and uterus: Secondary | ICD-10-CM | POA: Diagnosis not present

## 2016-10-23 DIAGNOSIS — R05 Cough: Secondary | ICD-10-CM | POA: Diagnosis not present

## 2016-10-23 DIAGNOSIS — Z79899 Other long term (current) drug therapy: Secondary | ICD-10-CM | POA: Diagnosis not present

## 2016-10-23 DIAGNOSIS — J209 Acute bronchitis, unspecified: Secondary | ICD-10-CM | POA: Diagnosis not present

## 2016-10-23 DIAGNOSIS — R531 Weakness: Secondary | ICD-10-CM | POA: Diagnosis not present

## 2016-10-23 DIAGNOSIS — R0602 Shortness of breath: Secondary | ICD-10-CM | POA: Diagnosis not present

## 2016-10-23 DIAGNOSIS — D649 Anemia, unspecified: Secondary | ICD-10-CM | POA: Diagnosis not present

## 2016-10-23 DIAGNOSIS — R634 Abnormal weight loss: Secondary | ICD-10-CM | POA: Diagnosis not present

## 2016-10-25 ENCOUNTER — Encounter: Payer: Self-pay | Admitting: Gastroenterology

## 2016-11-05 ENCOUNTER — Ambulatory Visit (AMBULATORY_SURGERY_CENTER): Payer: PPO | Admitting: Gastroenterology

## 2016-11-05 ENCOUNTER — Encounter: Payer: Self-pay | Admitting: Gastroenterology

## 2016-11-05 VITALS — BP 165/88 | HR 82 | Temp 98.4°F | Resp 18 | Ht 62.0 in | Wt 118.0 lb

## 2016-11-05 DIAGNOSIS — K319 Disease of stomach and duodenum, unspecified: Secondary | ICD-10-CM

## 2016-11-05 DIAGNOSIS — K648 Other hemorrhoids: Secondary | ICD-10-CM

## 2016-11-05 DIAGNOSIS — D120 Benign neoplasm of cecum: Secondary | ICD-10-CM

## 2016-11-05 DIAGNOSIS — D129 Benign neoplasm of anus and anal canal: Secondary | ICD-10-CM

## 2016-11-05 DIAGNOSIS — K2900 Acute gastritis without bleeding: Secondary | ICD-10-CM

## 2016-11-05 DIAGNOSIS — K621 Rectal polyp: Secondary | ICD-10-CM | POA: Diagnosis not present

## 2016-11-05 DIAGNOSIS — B9681 Helicobacter pylori [H. pylori] as the cause of diseases classified elsewhere: Secondary | ICD-10-CM | POA: Diagnosis not present

## 2016-11-05 DIAGNOSIS — K2950 Unspecified chronic gastritis without bleeding: Secondary | ICD-10-CM | POA: Diagnosis not present

## 2016-11-05 DIAGNOSIS — K635 Polyp of colon: Secondary | ICD-10-CM

## 2016-11-05 DIAGNOSIS — D508 Other iron deficiency anemias: Secondary | ICD-10-CM

## 2016-11-05 DIAGNOSIS — D509 Iron deficiency anemia, unspecified: Secondary | ICD-10-CM | POA: Diagnosis not present

## 2016-11-05 MED ORDER — SODIUM CHLORIDE 0.9 % IV SOLN: 500.0000 mL | INTRAVENOUS | Status: AC

## 2016-11-05 NOTE — Progress Notes (Signed)
Report to PACU, RN, vss, BBS= Clear.  

## 2016-11-05 NOTE — Progress Notes (Signed)
Called to room to assist during endoscopic procedure.  Patient ID and intended procedure confirmed with present staff. Received instructions for my participation in the procedure from the performing physician.  

## 2016-11-05 NOTE — Patient Instructions (Signed)
Impression/Recommendations:  Polyp handout given to patient. Diverticulosis handout given to patient. Hemorrhoid handout given to patient.  Gastritis handout given to patient.  No NSAIDs.  No ibuprofen, Naproxen.  Await pathology results.  YOU HAD AN ENDOSCOPIC PROCEDURE TODAY AT [facility]:   Refer to the procedure report that was given to you for any specific questions about what was found during the examination.  If the procedure report does not answer your questions, please call your gastroenterologist to clarify.  If you requested that your care partner not be given the details of your procedure findings, then the procedure report has been included in a sealed envelope for you to review at your convenience later.  YOU SHOULD EXPECT: Some feelings of bloating in the abdomen. Passage of more gas than usual.  Walking can help get rid of the air that was put into your GI tract during the procedure and reduce the bloating. If you had a lower endoscopy (such as a colonoscopy or flexible sigmoidoscopy) you may notice spotting of blood in your stool or on the toilet paper. If you underwent a bowel prep for your procedure, you may not have a normal bowel movement for a few days.  Please Note:  You might notice some irritation and congestion in your nose or some drainage.  This is from the oxygen used during your procedure.  There is no need for concern and it should clear up in a day or so.  SYMPTOMS TO REPORT IMMEDIATELY:   Following lower endoscopy (colonoscopy or flexible sigmoidoscopy):  Excessive amounts of blood in the stool  Significant tenderness or worsening of abdominal pains  Swelling of the abdomen that is new, acute  Fever of 100F or higher   Following upper endoscopy (EGD)  Vomiting of blood or coffee ground material  New chest pain or pain under the shoulder blades  Painful or persistently difficult swallowing  New shortness of breath  Fever of 100F or  higher  Black, tarry-looking stools  For urgent or emergent issues, a gastroenterologist can be reached at any hour by calling [phone].   DIET:  We do recommend a small meal at first, but then you may proceed to your regular diet.  Drink plenty of fluids but you should avoid alcoholic beverages for 24 hours.  ACTIVITY:  You should plan to take it easy for the rest of today and you should NOT DRIVE or use heavy machinery until tomorrow (because of the sedation medicines used during the test).    FOLLOW UP: Our staff will call the number listed on your records the next business day following your procedure to check on you and address any questions or concerns that you may have regarding the information given to you following your procedure. If we do not reach you, we will leave a message.  However, if you are feeling well and you are not experiencing any problems, there is no need to return our call.  We will assume that you have returned to your regular daily activities without incident.  If any biopsies were taken you will be contacted by phone or by letter within the next 1-3 weeks.  Please call us at [phone] if you have not heard about the biopsies in 3 weeks.    SIGNATURES/CONFIDENTIALITY: You and/or your care partner have signed paperwork which will be entered into your electronic medical record.  These signatures attest to the fact that that the information above on your After Visit  Summary has been reviewed and is understood.  Full responsibility of the confidentiality of this discharge information lies with you and/or your care-partner. 

## 2016-11-05 NOTE — Op Note (Signed)
[facility] Patient Name: Patricia Flowers Procedure Date: 11/05/2016 3:24 PM MRN: [id number] Endoscopist: Remo Lipps P. Armbruster MD, MD Age: 76 Referring MD:  Date of Birth: [birth year] Gender: Female Account #: [id number] Procedure:                Upper GI endoscopy Indications:              Iron deficiency anemia, improved on oral iron Medicines:                Monitored Anesthesia Care Procedure:                Pre-Anesthesia Assessment:                           - Prior to the procedure, a History and Physical                            was performed, and patient medications and                            allergies were reviewed. The patient's tolerance of                            previous anesthesia was also reviewed. The risks                            and benefits of the procedure and the sedation                            options and risks were discussed with the patient.                            All questions were answered, and informed consent                            was obtained. Prior Anticoagulants: The patient has                            taken no previous anticoagulant or antiplatelet                            agents. ASA Grade Assessment: II - A patient with                            mild systemic disease. After reviewing the risks                            and benefits, the patient was deemed in                            satisfactory condition to undergo the procedure.                           After obtaining informed consent, the endoscope was  passed under direct vision. Throughout the                            procedure, the patient's blood pressure, pulse, and                            oxygen saturations were monitored continuously. The                            Model GIF-HQ190 [id number]) scope was introduced                            through the mouth, and advanced to the second part   of duodenum. The upper GI endoscopy was                            accomplished without difficulty. The patient                            tolerated the procedure well. Scope In: Scope Out: Findings:                 Esophagogastric landmarks were identified: the                            Z-line was found at 37 cm, the gastroesophageal                            junction was found at 37 cm and the upper extent of                            the gastric folds was found at 37 cm from the                            incisors.                           The exam of the esophagus was otherwise normal.                           Diffuse mild inflammation characterized by erythema                            and granularity was found in the entire examined                            stomach, without focal ulceration or erosion.                            Biopsies were taken from the antrum / body with a                            cold forceps for Helicobacter pylori testing.  The exam of the stomach was otherwise normal.                           The duodenal bulb and second portion of the                            duodenum were normal. Complications:            No immediate complications. Estimated blood loss:                            Minimal. Estimated Blood Loss:     Estimated blood loss was minimal. Impression:               - Esophagogastric landmarks identified.                           - Normal esophagus                           - Gastritis. Biopsied.                           - Normal duodenal bulb and second portion of the                            duodenum. Recommendation:           - Patient has a contact number available for                            emergencies. The signs and symptoms of potential                            delayed complications were discussed with the                            patient. Return to normal activities tomorrow.                             Written discharge instructions were provided to the                            patient.                           - Resume previous diet.                           - Continue present medications.                           - Await pathology results.                           - No NSAIDs Carlota Raspberry. Armbruster MD, MD 11/05/2016 4:17:30 PM This report has been signed electronically.

## 2016-11-05 NOTE — Op Note (Signed)
[facility] Patient Name: Patricia Flowers Procedure Date: 11/05/2016 3:24 PM MRN: [id number] Endoscopist: Remo Lipps P. Armbruster MD, MD Age: 76 Referring MD:  Date of Birth: [birth year] Gender: Female Account #: [id number] Procedure:                Colonoscopy Indications:              Iron deficiency anemia Medicines:                Monitored Anesthesia Care Procedure:                Pre-Anesthesia Assessment:                           - Prior to the procedure, a History and Physical                            was performed, and patient medications and                            allergies were reviewed. The patient's tolerance of                            previous anesthesia was also reviewed. The risks                            and benefits of the procedure and the sedation                            options and risks were discussed with the patient.                            All questions were answered, and informed consent                            was obtained. Prior Anticoagulants: The patient has                            taken no previous anticoagulant or antiplatelet                            agents. ASA Grade Assessment: II - A patient with                            mild systemic disease. After reviewing the risks                            and benefits, the patient was deemed in                            satisfactory condition to undergo the procedure.                           After obtaining informed consent, the colonoscope  was passed under direct vision. Throughout the                            procedure, the patient's blood pressure, pulse, and                            oxygen saturations were monitored continuously. The                            Model CF-HQ190L [id number] [id number]) scope was introduced                            through the anus and advanced to the the terminal                            ileum, with identification of  the appendiceal                            orifice and IC valve. The colonoscopy was performed                            without difficulty. The patient tolerated the                            procedure well. The quality of the bowel                            preparation was adequate. The terminal ileum,                            ileocecal valve, appendiceal orifice, and rectum                            were photographed. Scope In: 3:43:57 PM Scope Out: 4:08:54 PM Scope Withdrawal Time: 0 hours 19 minutes 4 seconds  Total Procedure Duration: 0 hours 24 minutes 57 seconds  Findings:                 The perianal and digital rectal examinations were                            normal.                           A 4 mm polyp was found in the cecum. The polyp was                            sessile. The polyp was removed with a cold snare.                            Resection and retrieval were complete.                           The terminal ileum appeared normal.  Multiple medium-mouthed diverticula were found in                            the left colon.                           The colon was extremely tortuous.                           A polypoid lesion versus inflamed hemorrhoid was                            found in the distal rectum / dentate line. The                            lesion was sessile. Mucosa was biopsied with a cold                            forceps for histology to rule out adenoma.                           Internal hemorrhoids were found during retroflexion.                           The exam was otherwise without abnormality. Complications:            No immediate complications. Estimated blood loss:                            Minimal. Estimated Blood Loss:     Estimated blood loss was minimal. Impression:               - One 4 mm polyp in the cecum, removed with a cold                            snare. Resected and retrieved.                            - The examined portion of the ileum was normal.                           - Diverticulosis in the left colon.                           - Tortuous colon.                           - Polypoid lesion versus inflamed hemorrhoid in the                            distal rectum / dentate line. Biopsied.                           - Internal hemorrhoids.                           -  The examination was otherwise normal. Recommendation:           - Patient has a contact number available for                            emergencies. The signs and symptoms of potential                            delayed complications were discussed with the                            patient. Return to normal activities tomorrow.                            Written discharge instructions were provided to the                            patient.                           - Resume previous diet.                           - Continue present medications.                           - No ibuprofen, naproxen, or other non-steroidal                            anti-inflammatory drugs for 2 weeks after polyp                            removal.                           - Await pathology results. Remo Lipps P. Armbruster MD, MD 11/05/2016 4:14:54 PM This report has been signed electronically.

## 2016-11-06 ENCOUNTER — Telehealth: Payer: Self-pay

## 2016-11-06 NOTE — Telephone Encounter (Signed)
  Follow up Call-  Call back number 11/05/2016  Post procedure Call Back phone  # [phone]  Permission to leave phone message Yes     Patient questions:  Do you have a fever, pain , or abdominal swelling? No. Pain Score  0 *  Have you tolerated food without any problems? Yes.    Have you been able to return to your normal activities? Yes.    Do you have any questions about your discharge instructions: Diet   No. Medications  No. Follow up visit  No.  Do you have questions or concerns about your Care? No.  Actions: * If pain score is 4 or above: No action needed, pain <4.

## 2016-11-09 ENCOUNTER — Other Ambulatory Visit: Payer: Self-pay

## 2016-11-09 MED ORDER — BIS SUBCIT-METRONID-TETRACYC 140-125-125 MG PO CAPS: 3.0000 | ORAL_CAPSULE | Freq: Three times a day (TID) | ORAL | 0 refills | Status: DC

## 2016-11-12 ENCOUNTER — Other Ambulatory Visit: Payer: Self-pay

## 2016-11-12 DIAGNOSIS — A048 Other specified bacterial intestinal infections: Secondary | ICD-10-CM

## 2016-11-12 MED ORDER — OMEPRAZOLE 40 MG PO CPDR: 40.0000 mg | DELAYED_RELEASE_CAPSULE | Freq: Every day | ORAL | 0 refills | Status: DC

## 2016-11-12 NOTE — Progress Notes (Signed)
Rx sent 

## 2016-11-27 ENCOUNTER — Encounter: Payer: PPO | Admitting: Gastroenterology

## 2017-01-01 ENCOUNTER — Telehealth: Payer: Self-pay | Admitting: Family Medicine

## 2017-01-01 NOTE — Telephone Encounter (Signed)
Left pt message asking to call Allison back directly at [phone] to schedule AWV. Thanks! °

## 2017-01-11 ENCOUNTER — Other Ambulatory Visit: Payer: Self-pay

## 2017-01-11 DIAGNOSIS — D509 Iron deficiency anemia, unspecified: Secondary | ICD-10-CM

## 2017-01-14 ENCOUNTER — Telehealth: Payer: Self-pay | Admitting: Family Medicine

## 2017-01-14 NOTE — Telephone Encounter (Signed)
Called and spoke with the pt and informed her of the message below.  Pt verbalized understanding and agreed.  Pt was scheduled an appt for (Mon-01/21/17 @ 2:00pm).//AB/CMA

## 2017-01-14 NOTE — Telephone Encounter (Signed)
Caller name: Adrienne Relationship to patient: self Can be reached: [phone]  Reason for call: Pt states that she completed bismuth-metronidazole-tetracycline (PYLERA) 140-125-125 MG capsule  and was told she would have f/u of stool testing she thinks to make sure that everything was killed off. She has had no f/u and is wanting to do thru PCP if possible. Please advise.

## 2017-01-14 NOTE — Telephone Encounter (Signed)
Have her come to discuss. I can manage it but need to ask a few more questions. TY.

## 2017-01-21 ENCOUNTER — Ambulatory Visit (INDEPENDENT_AMBULATORY_CARE_PROVIDER_SITE_OTHER): Payer: PPO | Admitting: Family Medicine

## 2017-01-21 ENCOUNTER — Encounter: Payer: Self-pay | Admitting: Family Medicine

## 2017-01-21 VITALS — BP 140/90 | HR 92 | Temp 98.2°F | Ht 62.0 in | Wt 115.0 lb

## 2017-01-21 DIAGNOSIS — L299 Pruritus, unspecified: Secondary | ICD-10-CM | POA: Diagnosis not present

## 2017-01-21 DIAGNOSIS — Z9189 Other specified personal risk factors, not elsewhere classified: Secondary | ICD-10-CM

## 2017-01-21 DIAGNOSIS — Z8619 Personal history of other infectious and parasitic diseases: Secondary | ICD-10-CM

## 2017-01-21 DIAGNOSIS — D509 Iron deficiency anemia, unspecified: Secondary | ICD-10-CM

## 2017-01-21 NOTE — Progress Notes (Signed)
Chief Complaint  Patient presents with  . Follow-up    on H-pylori infection which was dx and tx'ed by GI,also f/u on iron def. anemia    Subjective: Patient is a 76 y.o. female here for f/u H pylori infection.  Patient was diagnosed with H. pylori after upper endoscopy and colonoscopy. She was placed on triple therapy which he finished approximate 5-6 weeks ago. She was told to follow-up, however was never contacted for the follow-up test of cure by the gastroenterologist's office and asked if our office can do it. Her symptoms have since resolved.  The patient also has a history of iron deficiency anemia, for which she was referred to gastroenterology in the first place. She's not been checked since being treated area and she does take supplemental iron once daily for around 3 times per week. She stated that when she took the iron more frequent we per day, and gave her a stomachache.  She also complains of intermittent and various patches of itching. She does not use lotion routinely, however does use many various natural oils. No new topical products.  She has a history of a 10 years CVD risk of greater than 20%. She was recommended to start taking a statin, however she refused. She declines again today.  ROS: Skin: +hair loss GI: No blood in the stool or dark tarry stools  Family History  Problem Relation Age of Onset  . Heart disease Mother   . Colon cancer Neg Hx    Past Medical History:  Diagnosis Date  . Anemia   . Hyperlipidemia   . Hypertension   . Iron deficiency anemia 10/04/2016  . Macular degeneration    Allergies  Allergen Reactions  . Iodine Itching and Swelling  . Shellfish Allergy Swelling    Occurred when fishing and touched raw shrimp    Current Outpatient Prescriptions:  .  AMBULATORY NON FORMULARY MEDICATION, NattoPlus , one capsule twice daily for blood pressure, Disp: , Rfl:  .  AMBULATORY NON FORMULARY MEDICATION, Systolex tablets , one tablet twice  daily for blood pressure, Disp: , Rfl:  .  AMBULATORY NON FORMULARY MEDICATION, Takes activia yoga and the following as well  Coconut oil, honey, cinnamon, vinegar, beet juice, Disp: , Rfl:  .  Ascorbic Acid (VITAMIN C) 1000 MG tablet, Take 1,000 mg by mouth daily., Disp: , Rfl:  .  Biotin 10000 MCG TABS, Take by mouth., Disp: , Rfl:  .  Coenzyme Q10 (COQ-10) 100 MG CAPS, Take by mouth., Disp: , Rfl:  .  ferrous sulfate 325 (65 FE) MG tablet, Take 325 mg by mouth 3 (three) times daily with meals., Disp: , Rfl:  .  folic acid (FOLVITE) 403 MCG tablet, Take 400 mcg by mouth daily., Disp: , Rfl:  .  Krill Oil (MAXIMUM RED KRILL PO), Take 800 mg by mouth. , Disp: , Rfl:  .  Multiple Vitamins-Minerals (PRESERVISION AREDS 2) CAPS, Take 2 capsules by mouth daily., Disp: , Rfl:  .  OVER THE COUNTER MEDICATION, Preservision-Take 2 gel capsules daily., Disp: , Rfl:  .  OVER THE COUNTER MEDICATION, Bee Pollen-Take 1 tablet by mouth daily., Disp: , Rfl:  .  TURMERIC PO, Take 600 mg by mouth., Disp: , Rfl:  .  vitamin B-12 (CYANOCOBALAMIN) 1000 MCG tablet, Take 1,000 mcg by mouth 3 (three) times a week., Disp: , Rfl:  .  vitamin E 1000 UNIT capsule, Take 1,000 Units by mouth every other day., Disp: , Rfl:  .  VITAMIN K PO, Take by mouth. Vitamin K-Take 1 capsule by mouth three times a week., Disp: , Rfl:   Current Facility-Administered Medications:  .  0.9 %  sodium chloride infusion, 500 mL, Intravenous, Continuous, Manus Gunning, MD  Objective: BP 140/90 (BP Location: Left Arm, Patient Position: Sitting, Cuff Size: Normal)   Pulse 92   Temp 98.2 F (36.8 C) (Oral)   Ht 5\' 2"  (1.575 m)   Wt 115 lb (52.2 kg)   SpO2 96%   BMI 21.03 kg/m  General: Awake, appears stated age HEENT: MMM, EOMi Heart: RRR, no murmurs Lungs: CTAB, no rales, wheezes or rhonchi. No accessory muscle use Abd: BS+, soft, NT, ND, no masses or organomegaly Psych: Age appropriate judgment and insight, normal affect and  mood  Assessment and Plan: Iron deficiency anemia, unspecified iron deficiency anemia type - Plan: Ferritin, CBC, IBC panel  History of Helicobacter pylori infection - Plan: H. pylori breath test  [place] cardiac risk >20% in next 10 years - Plan: Lipid panel  Itching  Orders as above. Hair loss likely related to iron deficiency, if she is still deficient and unable to tolerate oral iron, will refer to hematology for IV iron infusions. We'll check for H. pylori via breath test on behalf gastroenterology. Discussed how statin therapy could decrease her risk of heart attack and stroke, however she still declines. We will recheck her cholesterol in 6 months and discuss if she is willing to make a change at that time. She is to let us know if she changes her mind the next 6 months. Itching is likely related to dry skin. Recommended using emollients once twice daily, try to stay away from scented products as this can be irritating to skin. Follow-up in 6 months or prn. The patient voiced understanding and agreement to the plan.  [place], DO 01/21/17  5:10 PM

## 2017-01-21 NOTE — Patient Instructions (Addendum)
Give [place] 2-3 business days to get you your lab results.   The cardiac societies would suggest that you go on a cholesterol lowering medicine to decrease your risk of heart attack and stroke over the next 10 years.

## 2017-01-21 NOTE — Progress Notes (Signed)
Pre visit review using our clinic review tool, if applicable. No additional management support is needed unless otherwise documented below in the visit note. 

## 2017-01-22 LAB — LIPID PANEL
Cholesterol: 221 mg/dL — ABNORMAL HIGH (ref 0–200)
HDL: 71.4 mg/dL (ref >39.00)
LDL Cholesterol: 124 mg/dL — ABNORMAL HIGH (ref 0–99)
NONHDL: 149.13
TRIGLYCERIDES: 124 mg/dL (ref 0.0–149.0)
Total CHOL/HDL Ratio: 3
VLDL: 24.8 mg/dL (ref 0.0–40.0)

## 2017-01-22 LAB — H. PYLORI BREATH TEST: H. PYLORI BREATH TEST: NOT DETECTED

## 2017-01-22 LAB — CBC
HEMATOCRIT: 42.1 % (ref 36.0–46.0)
HEMOGLOBIN: 14.1 g/dL (ref 12.0–15.0)
MCHC: 33.4 g/dL (ref 30.0–36.0)
MCV: 88.5 fl (ref 78.0–100.0)
PLATELETS: 258 10*3/uL (ref 150.0–400.0)
RBC: 4.76 Mil/uL (ref 3.87–5.11)
RDW: 14.2 % (ref 11.5–15.5)
WBC: 9.4 10*3/uL (ref 4.0–10.5)

## 2017-01-22 LAB — IBC PANEL
Iron: 34 ug/dL — ABNORMAL LOW (ref 42–145)
Saturation Ratios: 9.7 % — ABNORMAL LOW (ref 20.0–50.0)
Transferrin: 251 mg/dL (ref 212.0–360.0)

## 2017-01-22 LAB — FERRITIN: Ferritin: 21.1 ng/mL (ref 10.0–291.0)

## 2017-02-14 NOTE — Telephone Encounter (Signed)
Pt declined AWV with health coach. Pt states she did her check up with Dr. Nani Ravens in April

## 2017-02-21 DIAGNOSIS — H35721 Serous detachment of retinal pigment epithelium, right eye: Secondary | ICD-10-CM | POA: Diagnosis not present

## 2017-02-21 DIAGNOSIS — H353132 Nonexudative age-related macular degeneration, bilateral, intermediate dry stage: Secondary | ICD-10-CM | POA: Diagnosis not present

## 2017-02-21 DIAGNOSIS — H25813 Combined forms of age-related cataract, bilateral: Secondary | ICD-10-CM | POA: Diagnosis not present

## 2017-02-21 DIAGNOSIS — H43811 Vitreous degeneration, right eye: Secondary | ICD-10-CM | POA: Diagnosis not present

## 2017-07-18 DIAGNOSIS — H43811 Vitreous degeneration, right eye: Secondary | ICD-10-CM | POA: Diagnosis not present

## 2017-07-18 DIAGNOSIS — H25813 Combined forms of age-related cataract, bilateral: Secondary | ICD-10-CM | POA: Diagnosis not present

## 2017-07-18 DIAGNOSIS — H35721 Serous detachment of retinal pigment epithelium, right eye: Secondary | ICD-10-CM | POA: Diagnosis not present

## 2017-07-18 DIAGNOSIS — H353132 Nonexudative age-related macular degeneration, bilateral, intermediate dry stage: Secondary | ICD-10-CM | POA: Diagnosis not present

## 2017-12-02 DIAGNOSIS — H209 Unspecified iridocyclitis: Secondary | ICD-10-CM | POA: Diagnosis not present

## 2017-12-04 DIAGNOSIS — H209 Unspecified iridocyclitis: Secondary | ICD-10-CM | POA: Diagnosis not present

## 2017-12-05 DIAGNOSIS — H40052 Ocular hypertension, left eye: Secondary | ICD-10-CM | POA: Diagnosis not present

## 2017-12-05 DIAGNOSIS — H43812 Vitreous degeneration, left eye: Secondary | ICD-10-CM | POA: Diagnosis not present

## 2017-12-05 DIAGNOSIS — H353132 Nonexudative age-related macular degeneration, bilateral, intermediate dry stage: Secondary | ICD-10-CM | POA: Diagnosis not present

## 2017-12-05 DIAGNOSIS — H27112 Subluxation of lens, left eye: Secondary | ICD-10-CM | POA: Diagnosis not present

## 2017-12-05 DIAGNOSIS — H25813 Combined forms of age-related cataract, bilateral: Secondary | ICD-10-CM | POA: Diagnosis not present

## 2017-12-05 DIAGNOSIS — H1132 Conjunctival hemorrhage, left eye: Secondary | ICD-10-CM | POA: Diagnosis not present

## 2017-12-05 DIAGNOSIS — H209 Unspecified iridocyclitis: Secondary | ICD-10-CM | POA: Diagnosis not present

## 2017-12-13 DIAGNOSIS — H209 Unspecified iridocyclitis: Secondary | ICD-10-CM | POA: Diagnosis not present

## 2017-12-13 DIAGNOSIS — H21552 Recession of chamber angle, left eye: Secondary | ICD-10-CM | POA: Diagnosis not present

## 2017-12-13 DIAGNOSIS — H27112 Subluxation of lens, left eye: Secondary | ICD-10-CM | POA: Diagnosis not present

## 2017-12-13 DIAGNOSIS — H40003 Preglaucoma, unspecified, bilateral: Secondary | ICD-10-CM | POA: Diagnosis not present

## 2017-12-13 DIAGNOSIS — H25813 Combined forms of age-related cataract, bilateral: Secondary | ICD-10-CM | POA: Diagnosis not present

## 2017-12-13 DIAGNOSIS — H40052 Ocular hypertension, left eye: Secondary | ICD-10-CM | POA: Diagnosis not present

## 2017-12-16 DIAGNOSIS — H209 Unspecified iridocyclitis: Secondary | ICD-10-CM | POA: Diagnosis not present

## 2017-12-16 DIAGNOSIS — H40052 Ocular hypertension, left eye: Secondary | ICD-10-CM | POA: Diagnosis not present

## 2017-12-16 DIAGNOSIS — H21552 Recession of chamber angle, left eye: Secondary | ICD-10-CM | POA: Diagnosis not present

## 2017-12-20 DIAGNOSIS — H209 Unspecified iridocyclitis: Secondary | ICD-10-CM | POA: Diagnosis not present

## 2017-12-20 DIAGNOSIS — H40052 Ocular hypertension, left eye: Secondary | ICD-10-CM | POA: Diagnosis not present

## 2017-12-20 DIAGNOSIS — H27112 Subluxation of lens, left eye: Secondary | ICD-10-CM | POA: Diagnosis not present

## 2017-12-25 DIAGNOSIS — H27112 Subluxation of lens, left eye: Secondary | ICD-10-CM | POA: Diagnosis not present

## 2017-12-25 DIAGNOSIS — H209 Unspecified iridocyclitis: Secondary | ICD-10-CM | POA: Diagnosis not present

## 2017-12-25 DIAGNOSIS — H40052 Ocular hypertension, left eye: Secondary | ICD-10-CM | POA: Diagnosis not present

## 2017-12-30 DIAGNOSIS — Z01818 Encounter for other preprocedural examination: Secondary | ICD-10-CM | POA: Diagnosis not present

## 2017-12-30 DIAGNOSIS — R030 Elevated blood-pressure reading, without diagnosis of hypertension: Secondary | ICD-10-CM | POA: Diagnosis not present

## 2017-12-30 DIAGNOSIS — H27112 Subluxation of lens, left eye: Secondary | ICD-10-CM | POA: Diagnosis not present

## 2018-01-02 DIAGNOSIS — H27112 Subluxation of lens, left eye: Secondary | ICD-10-CM | POA: Diagnosis not present

## 2018-01-13 DIAGNOSIS — H35363 Drusen (degenerative) of macula, bilateral: Secondary | ICD-10-CM | POA: Diagnosis not present

## 2018-01-13 DIAGNOSIS — H27112 Subluxation of lens, left eye: Secondary | ICD-10-CM | POA: Diagnosis not present

## 2018-02-03 DIAGNOSIS — H35353 Cystoid macular degeneration, bilateral: Secondary | ICD-10-CM | POA: Diagnosis not present

## 2018-02-03 DIAGNOSIS — H40052 Ocular hypertension, left eye: Secondary | ICD-10-CM | POA: Diagnosis not present

## 2018-02-03 DIAGNOSIS — H27112 Subluxation of lens, left eye: Secondary | ICD-10-CM | POA: Diagnosis not present

## 2018-03-13 DIAGNOSIS — H524 Presbyopia: Secondary | ICD-10-CM | POA: Diagnosis not present

## 2018-03-13 DIAGNOSIS — H52203 Unspecified astigmatism, bilateral: Secondary | ICD-10-CM | POA: Diagnosis not present

## 2018-03-31 DIAGNOSIS — H353132 Nonexudative age-related macular degeneration, bilateral, intermediate dry stage: Secondary | ICD-10-CM | POA: Diagnosis not present

## 2018-03-31 DIAGNOSIS — H35352 Cystoid macular degeneration, left eye: Secondary | ICD-10-CM | POA: Diagnosis not present

## 2018-03-31 DIAGNOSIS — H27112 Subluxation of lens, left eye: Secondary | ICD-10-CM | POA: Diagnosis not present

## 2018-04-16 DIAGNOSIS — H35352 Cystoid macular degeneration, left eye: Secondary | ICD-10-CM | POA: Diagnosis not present

## 2018-04-16 DIAGNOSIS — Z79899 Other long term (current) drug therapy: Secondary | ICD-10-CM | POA: Diagnosis not present

## 2018-04-16 DIAGNOSIS — H40052 Ocular hypertension, left eye: Secondary | ICD-10-CM | POA: Diagnosis not present

## 2018-04-16 DIAGNOSIS — H35363 Drusen (degenerative) of macula, bilateral: Secondary | ICD-10-CM | POA: Diagnosis not present

## 2018-04-16 DIAGNOSIS — H35372 Puckering of macula, left eye: Secondary | ICD-10-CM | POA: Diagnosis not present

## 2018-04-16 DIAGNOSIS — H353221 Exudative age-related macular degeneration, left eye, with active choroidal neovascularization: Secondary | ICD-10-CM | POA: Diagnosis not present

## 2018-04-16 DIAGNOSIS — H3530 Unspecified macular degeneration: Secondary | ICD-10-CM | POA: Diagnosis not present

## 2018-05-21 DIAGNOSIS — H40052 Ocular hypertension, left eye: Secondary | ICD-10-CM | POA: Diagnosis not present

## 2018-05-21 DIAGNOSIS — H35352 Cystoid macular degeneration, left eye: Secondary | ICD-10-CM | POA: Diagnosis not present

## 2018-05-21 DIAGNOSIS — Z79899 Other long term (current) drug therapy: Secondary | ICD-10-CM | POA: Diagnosis not present

## 2018-05-21 DIAGNOSIS — H353132 Nonexudative age-related macular degeneration, bilateral, intermediate dry stage: Secondary | ICD-10-CM | POA: Diagnosis not present

## 2018-06-20 DIAGNOSIS — H40052 Ocular hypertension, left eye: Secondary | ICD-10-CM | POA: Diagnosis not present

## 2018-06-20 DIAGNOSIS — H35352 Cystoid macular degeneration, left eye: Secondary | ICD-10-CM | POA: Diagnosis not present

## 2018-06-20 DIAGNOSIS — H353132 Nonexudative age-related macular degeneration, bilateral, intermediate dry stage: Secondary | ICD-10-CM | POA: Diagnosis not present

## 2018-06-20 DIAGNOSIS — Z79899 Other long term (current) drug therapy: Secondary | ICD-10-CM | POA: Diagnosis not present

## 2018-08-01 DIAGNOSIS — H35352 Cystoid macular degeneration, left eye: Secondary | ICD-10-CM | POA: Diagnosis not present

## 2018-08-01 DIAGNOSIS — H40052 Ocular hypertension, left eye: Secondary | ICD-10-CM | POA: Diagnosis not present

## 2018-08-01 DIAGNOSIS — H35363 Drusen (degenerative) of macula, bilateral: Secondary | ICD-10-CM | POA: Diagnosis not present

## 2018-08-01 DIAGNOSIS — H353132 Nonexudative age-related macular degeneration, bilateral, intermediate dry stage: Secondary | ICD-10-CM | POA: Diagnosis not present

## 2018-09-24 DIAGNOSIS — H3581 Retinal edema: Secondary | ICD-10-CM | POA: Diagnosis not present

## 2018-09-24 DIAGNOSIS — H35352 Cystoid macular degeneration, left eye: Secondary | ICD-10-CM | POA: Diagnosis not present

## 2018-09-24 DIAGNOSIS — H353132 Nonexudative age-related macular degeneration, bilateral, intermediate dry stage: Secondary | ICD-10-CM | POA: Diagnosis not present

## 2018-09-24 DIAGNOSIS — Z9842 Cataract extraction status, left eye: Secondary | ICD-10-CM | POA: Diagnosis not present

## 2018-09-24 DIAGNOSIS — H40052 Ocular hypertension, left eye: Secondary | ICD-10-CM | POA: Diagnosis not present

## 2018-11-24 DIAGNOSIS — Z87828 Personal history of other (healed) physical injury and trauma: Secondary | ICD-10-CM | POA: Diagnosis not present

## 2018-11-24 DIAGNOSIS — H40052 Ocular hypertension, left eye: Secondary | ICD-10-CM | POA: Diagnosis not present

## 2018-11-24 DIAGNOSIS — H02402 Unspecified ptosis of left eyelid: Secondary | ICD-10-CM | POA: Diagnosis not present

## 2018-12-09 DIAGNOSIS — H40052 Ocular hypertension, left eye: Secondary | ICD-10-CM | POA: Diagnosis not present

## 2018-12-09 DIAGNOSIS — H35352 Cystoid macular degeneration, left eye: Secondary | ICD-10-CM | POA: Diagnosis not present

## 2018-12-09 DIAGNOSIS — Z87828 Personal history of other (healed) physical injury and trauma: Secondary | ICD-10-CM | POA: Diagnosis not present

## 2018-12-09 DIAGNOSIS — H35363 Drusen (degenerative) of macula, bilateral: Secondary | ICD-10-CM | POA: Diagnosis not present

## 2018-12-09 DIAGNOSIS — H04122 Dry eye syndrome of left lacrimal gland: Secondary | ICD-10-CM | POA: Diagnosis not present

## 2018-12-09 DIAGNOSIS — H353132 Nonexudative age-related macular degeneration, bilateral, intermediate dry stage: Secondary | ICD-10-CM | POA: Diagnosis not present

## 2018-12-24 DIAGNOSIS — Z87828 Personal history of other (healed) physical injury and trauma: Secondary | ICD-10-CM | POA: Diagnosis not present

## 2018-12-24 DIAGNOSIS — H02402 Unspecified ptosis of left eyelid: Secondary | ICD-10-CM | POA: Diagnosis not present

## 2018-12-24 DIAGNOSIS — H40052 Ocular hypertension, left eye: Secondary | ICD-10-CM | POA: Diagnosis not present

## 2018-12-27 IMAGING — CT CT ABD-PELV W/ CM
2 of 5 series · 16 of 46 positions shown, 18 images · IV contrast (iopamidol)
Comparison: None.

CLINICAL DATA: Loss of appetite.  Some weight loss.  Nausea.

EXAM:
CT ABDOMEN AND PELVIS WITH CONTRAST
TECHNIQUE: Multidetector CT imaging of the abdomen and pelvis was performed
using the standard protocol following bolus administration of
intravenous contrast.
CONTRAST:  100mL OPIT36-BQQ IOPAMIDOL (OPIT36-BQQ) INJECTION 61%

[Series 2: axial st · axial · 0.90mm/px · z∈[-412,-22]mm · 13 of 88 slices shown, 15 images]
[im 5/88  soft-tissue]
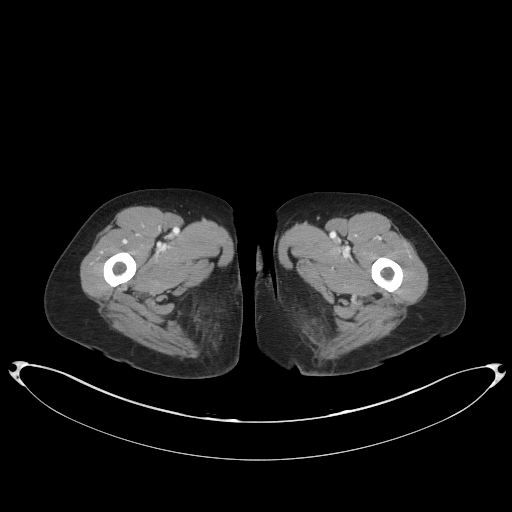
[im 5/88  bone]
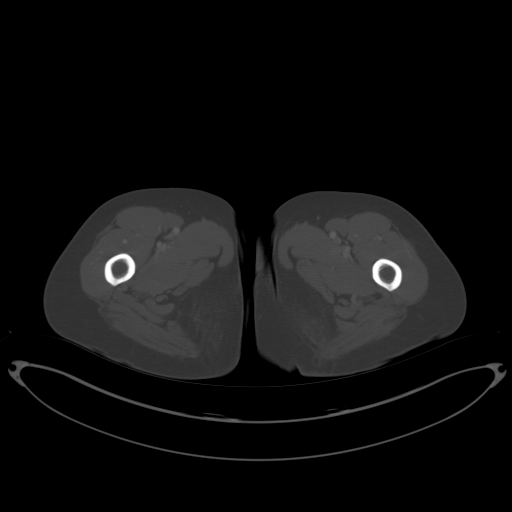
[im 10/88  soft-tissue]
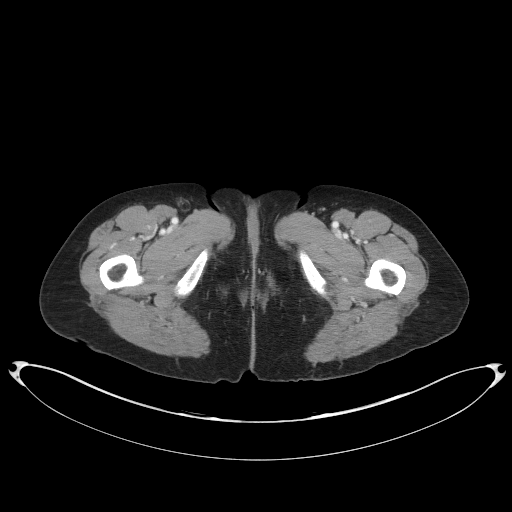
[im 20/88  soft-tissue]
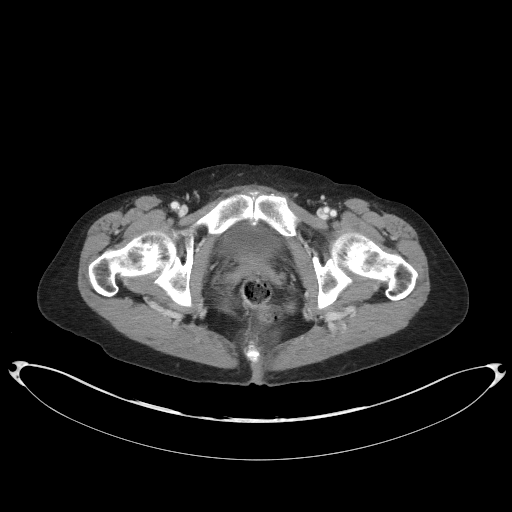
[im 25/88  soft-tissue]
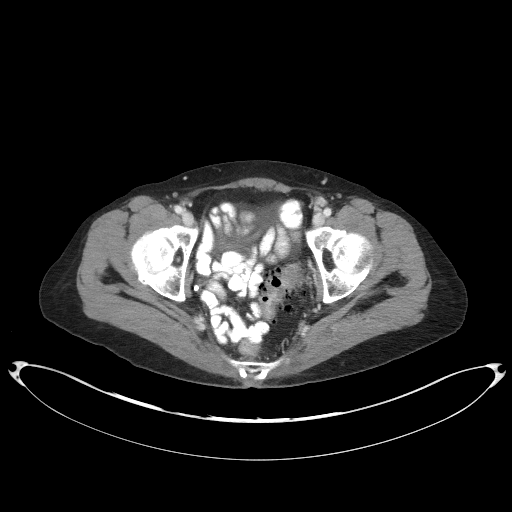
[im 30/88  soft-tissue]
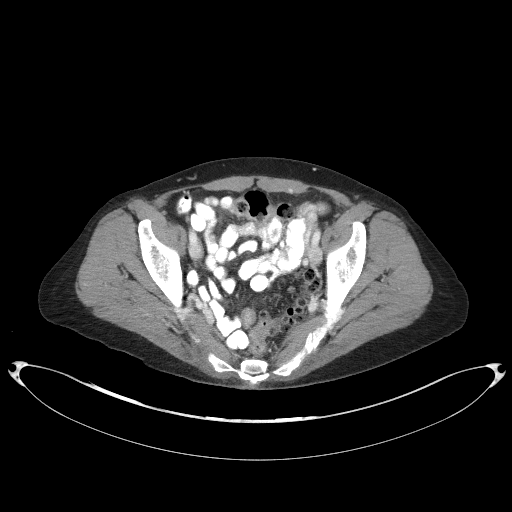
[im 39/88  soft-tissue]
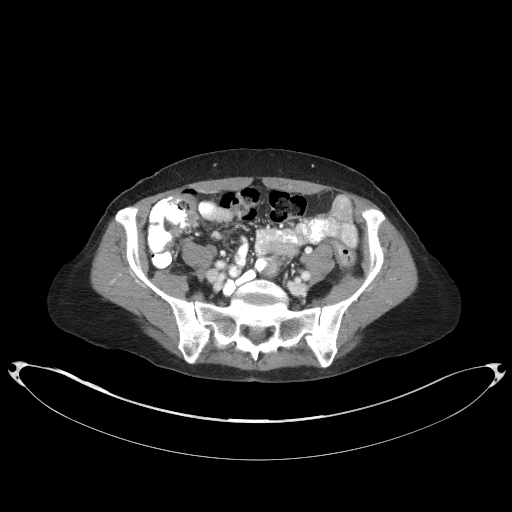
[im 44/88  soft-tissue]
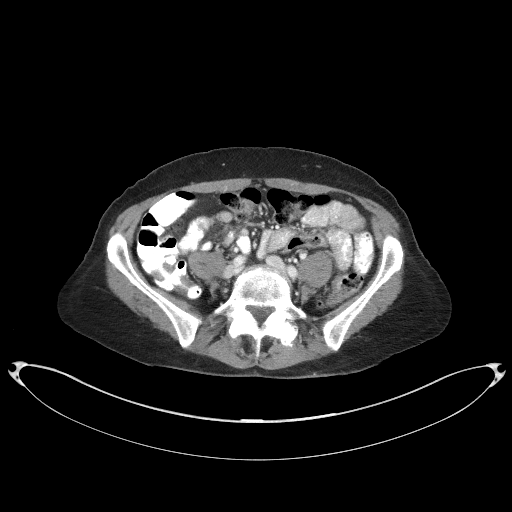
[im 49/88  soft-tissue]
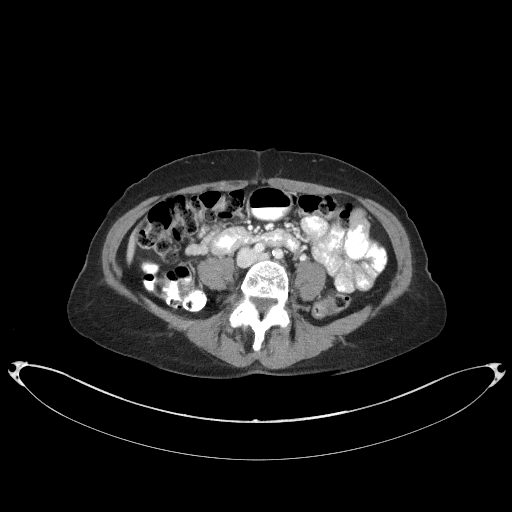
[im 59/88  soft-tissue]
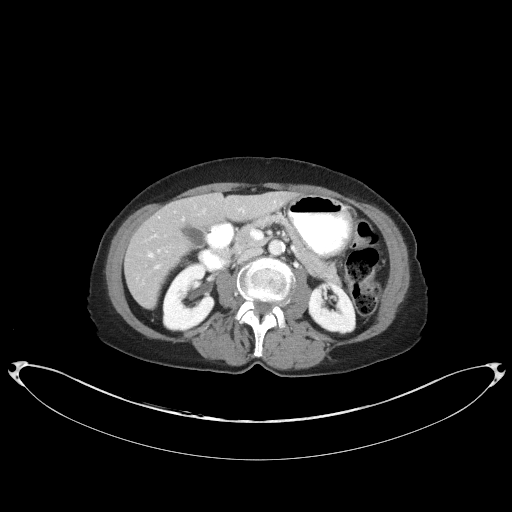
[im 59/88  bone]
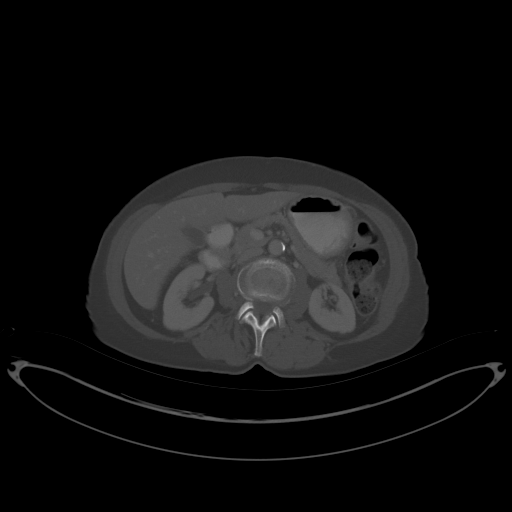
[im 63/88  soft-tissue]
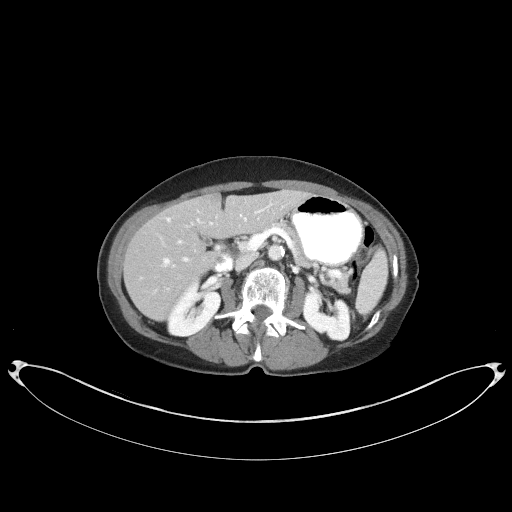
[im 68/88  soft-tissue]
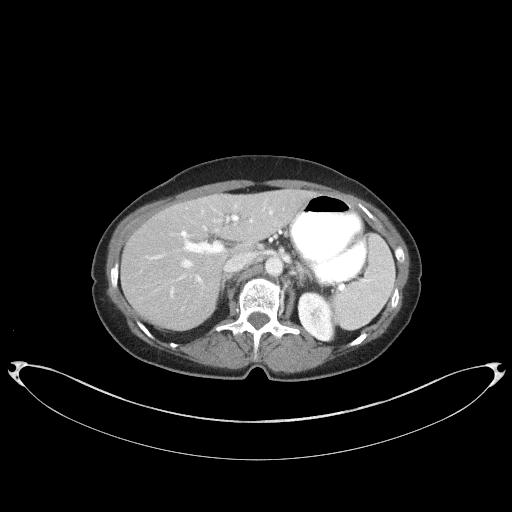
[im 78/88  soft-tissue]
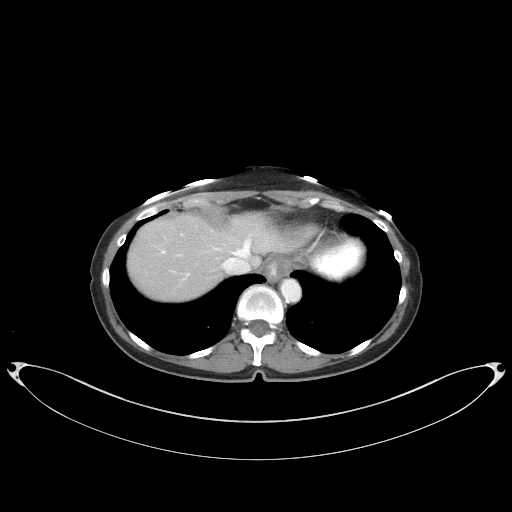
[im 83/88  soft-tissue]
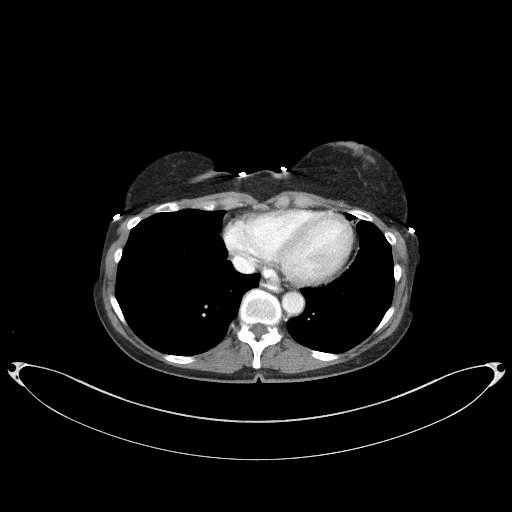

[Series 5: coronal st · coronal · 0.74mm/px · 3 of 76 slices shown]
[im 26/76  soft-tissue]
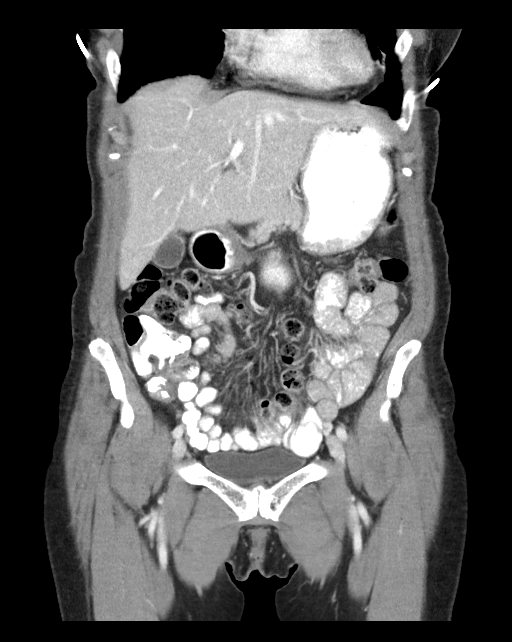
[im 34/76  soft-tissue]
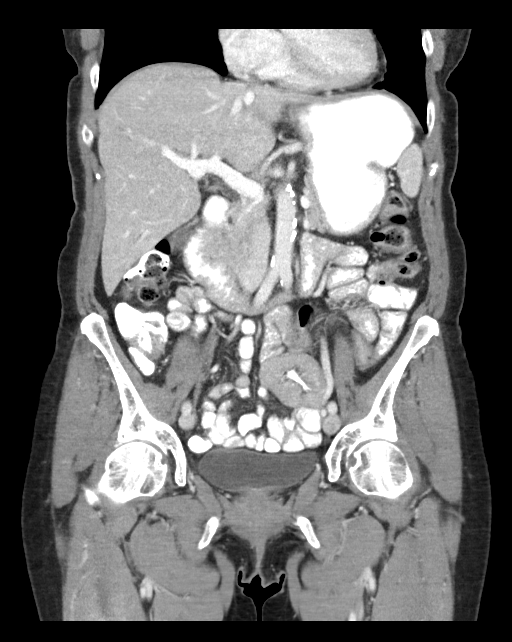
[im 42/76  soft-tissue]
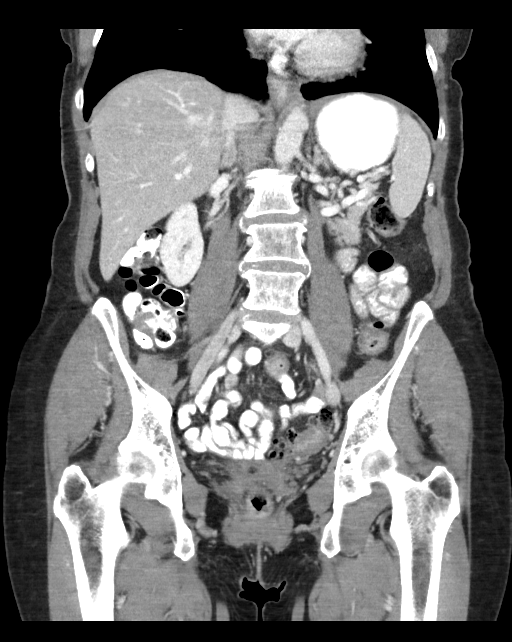

[16 of 46 positions shown; findings below may reference images not displayed]

FINDINGS: Lower chest: Lung bases clear.  Heart normal in size.

Hepatobiliary: No focal liver abnormality is seen. No gallstones,
gallbladder wall thickening, or biliary dilatation.

Pancreas: Unremarkable. No pancreatic ductal dilatation or
surrounding inflammatory changes.

Spleen: Normal in size without focal abnormality.

Adrenals/Urinary Tract: No adrenal masses. 5 mm low-density lesion
in the posterior midpole of the left kidney most likely a cyst. No
other renal masses or lesions, no stones and no hydronephrosis.
Normal ureters. Bladder is unremarkable.

Stomach/Bowel: Stomach and small bowel unremarkable. There are
numerous left colon diverticula mostly along the sigmoid. No
diverticulitis. Colon otherwise unremarkable. Normal appendix
visualized.

Vascular/Lymphatic: Aortic atherosclerosis. No enlarged abdominal or
pelvic lymph nodes.

Reproductive: Status post hysterectomy. No adnexal masses.

Other: No abdominal wall hernia or abnormality. No abdominopelvic
ascites.

Musculoskeletal: No acute finding. No osteoblastic or osteolytic
lesions. Degenerative changes most evident at L1-L2.
IMPRESSION: 1. No acute abnormalities within the abdomen or pelvis.
2. No evidence of malignancy.
3. Colonic diverticulosis, without evidence of diverticulitis.
4. Status post hysterectomy.

## 2019-05-08 ENCOUNTER — Other Ambulatory Visit: Payer: Self-pay

## 2019-07-21 DIAGNOSIS — U071 COVID-19: Secondary | ICD-10-CM | POA: Diagnosis not present

## 2019-07-21 DIAGNOSIS — M791 Myalgia, unspecified site: Secondary | ICD-10-CM | POA: Diagnosis not present

## 2019-07-21 DIAGNOSIS — R5383 Other fatigue: Secondary | ICD-10-CM | POA: Diagnosis not present

## 2019-09-09 DIAGNOSIS — H40052 Ocular hypertension, left eye: Secondary | ICD-10-CM | POA: Diagnosis not present

## 2019-09-09 DIAGNOSIS — H35352 Cystoid macular degeneration, left eye: Secondary | ICD-10-CM | POA: Diagnosis not present

## 2019-09-09 DIAGNOSIS — H353132 Nonexudative age-related macular degeneration, bilateral, intermediate dry stage: Secondary | ICD-10-CM | POA: Diagnosis not present

## 2019-09-09 DIAGNOSIS — H27112 Subluxation of lens, left eye: Secondary | ICD-10-CM | POA: Diagnosis not present

## 2019-09-26 DIAGNOSIS — N132 Hydronephrosis with renal and ureteral calculous obstruction: Secondary | ICD-10-CM | POA: Diagnosis not present

## 2019-09-26 DIAGNOSIS — M47816 Spondylosis without myelopathy or radiculopathy, lumbar region: Secondary | ICD-10-CM | POA: Diagnosis not present

## 2019-09-26 DIAGNOSIS — I700 Atherosclerosis of aorta: Secondary | ICD-10-CM | POA: Diagnosis not present

## 2019-09-26 DIAGNOSIS — N1330 Unspecified hydronephrosis: Secondary | ICD-10-CM | POA: Diagnosis not present

## 2019-09-26 DIAGNOSIS — K5730 Diverticulosis of large intestine without perforation or abscess without bleeding: Secondary | ICD-10-CM | POA: Diagnosis not present

## 2019-09-26 DIAGNOSIS — R1032 Left lower quadrant pain: Secondary | ICD-10-CM | POA: Diagnosis not present

## 2019-09-26 DIAGNOSIS — N201 Calculus of ureter: Secondary | ICD-10-CM | POA: Diagnosis not present

## 2019-09-26 DIAGNOSIS — K449 Diaphragmatic hernia without obstruction or gangrene: Secondary | ICD-10-CM | POA: Diagnosis not present

## 2019-09-26 DIAGNOSIS — R1012 Left upper quadrant pain: Secondary | ICD-10-CM | POA: Diagnosis not present

## 2019-09-26 DIAGNOSIS — R109 Unspecified abdominal pain: Secondary | ICD-10-CM | POA: Diagnosis not present

## 2019-09-28 DIAGNOSIS — Z01818 Encounter for other preprocedural examination: Secondary | ICD-10-CM | POA: Diagnosis not present

## 2019-09-28 DIAGNOSIS — N132 Hydronephrosis with renal and ureteral calculous obstruction: Secondary | ICD-10-CM | POA: Diagnosis not present

## 2019-09-28 DIAGNOSIS — R8290 Unspecified abnormal findings in urine: Secondary | ICD-10-CM | POA: Diagnosis not present

## 2019-09-28 DIAGNOSIS — Z20828 Contact with and (suspected) exposure to other viral communicable diseases: Secondary | ICD-10-CM | POA: Diagnosis not present

## 2019-09-28 DIAGNOSIS — N201 Calculus of ureter: Secondary | ICD-10-CM | POA: Diagnosis not present

## 2019-10-05 DIAGNOSIS — N201 Calculus of ureter: Secondary | ICD-10-CM | POA: Diagnosis not present

## 2019-10-14 DIAGNOSIS — H35352 Cystoid macular degeneration, left eye: Secondary | ICD-10-CM | POA: Diagnosis not present

## 2019-10-14 DIAGNOSIS — H27112 Subluxation of lens, left eye: Secondary | ICD-10-CM | POA: Diagnosis not present

## 2019-10-14 DIAGNOSIS — Z79899 Other long term (current) drug therapy: Secondary | ICD-10-CM | POA: Diagnosis not present

## 2019-10-14 DIAGNOSIS — H353132 Nonexudative age-related macular degeneration, bilateral, intermediate dry stage: Secondary | ICD-10-CM | POA: Diagnosis not present

## 2019-10-14 DIAGNOSIS — H40052 Ocular hypertension, left eye: Secondary | ICD-10-CM | POA: Diagnosis not present

## 2020-01-06 DIAGNOSIS — H353132 Nonexudative age-related macular degeneration, bilateral, intermediate dry stage: Secondary | ICD-10-CM | POA: Diagnosis not present

## 2020-01-06 DIAGNOSIS — H40052 Ocular hypertension, left eye: Secondary | ICD-10-CM | POA: Diagnosis not present

## 2020-01-06 DIAGNOSIS — H27112 Subluxation of lens, left eye: Secondary | ICD-10-CM | POA: Diagnosis not present

## 2020-01-06 DIAGNOSIS — Z79899 Other long term (current) drug therapy: Secondary | ICD-10-CM | POA: Diagnosis not present

## 2020-01-06 DIAGNOSIS — H35352 Cystoid macular degeneration, left eye: Secondary | ICD-10-CM | POA: Diagnosis not present

## 2020-03-30 DIAGNOSIS — Z79899 Other long term (current) drug therapy: Secondary | ICD-10-CM | POA: Diagnosis not present

## 2020-03-30 DIAGNOSIS — H40052 Ocular hypertension, left eye: Secondary | ICD-10-CM | POA: Diagnosis not present

## 2020-03-30 DIAGNOSIS — H353132 Nonexudative age-related macular degeneration, bilateral, intermediate dry stage: Secondary | ICD-10-CM | POA: Diagnosis not present

## 2020-03-30 DIAGNOSIS — H35352 Cystoid macular degeneration, left eye: Secondary | ICD-10-CM | POA: Diagnosis not present

## 2020-03-30 DIAGNOSIS — H27112 Subluxation of lens, left eye: Secondary | ICD-10-CM | POA: Diagnosis not present

## 2020-04-29 DIAGNOSIS — H35352 Cystoid macular degeneration, left eye: Secondary | ICD-10-CM | POA: Diagnosis not present

## 2020-04-29 DIAGNOSIS — H27112 Subluxation of lens, left eye: Secondary | ICD-10-CM | POA: Diagnosis not present

## 2020-04-29 DIAGNOSIS — H40052 Ocular hypertension, left eye: Secondary | ICD-10-CM | POA: Diagnosis not present

## 2020-04-29 DIAGNOSIS — H04123 Dry eye syndrome of bilateral lacrimal glands: Secondary | ICD-10-CM | POA: Diagnosis not present

## 2020-04-29 DIAGNOSIS — H353132 Nonexudative age-related macular degeneration, bilateral, intermediate dry stage: Secondary | ICD-10-CM | POA: Diagnosis not present

## 2020-05-02 DIAGNOSIS — H02401 Unspecified ptosis of right eyelid: Secondary | ICD-10-CM | POA: Diagnosis not present

## 2020-05-02 DIAGNOSIS — H4032X3 Glaucoma secondary to eye trauma, left eye, severe stage: Secondary | ICD-10-CM | POA: Diagnosis not present

## 2020-05-02 DIAGNOSIS — H04123 Dry eye syndrome of bilateral lacrimal glands: Secondary | ICD-10-CM | POA: Diagnosis not present

## 2020-05-02 DIAGNOSIS — H02402 Unspecified ptosis of left eyelid: Secondary | ICD-10-CM | POA: Diagnosis not present

## 2020-05-02 DIAGNOSIS — Z79899 Other long term (current) drug therapy: Secondary | ICD-10-CM | POA: Diagnosis not present

## 2020-06-29 DIAGNOSIS — H04122 Dry eye syndrome of left lacrimal gland: Secondary | ICD-10-CM | POA: Diagnosis not present

## 2020-06-29 DIAGNOSIS — Z79899 Other long term (current) drug therapy: Secondary | ICD-10-CM | POA: Diagnosis not present

## 2020-06-29 DIAGNOSIS — H40052 Ocular hypertension, left eye: Secondary | ICD-10-CM | POA: Diagnosis not present

## 2020-06-29 DIAGNOSIS — H27112 Subluxation of lens, left eye: Secondary | ICD-10-CM | POA: Diagnosis not present

## 2020-06-29 DIAGNOSIS — H353132 Nonexudative age-related macular degeneration, bilateral, intermediate dry stage: Secondary | ICD-10-CM | POA: Diagnosis not present

## 2020-06-29 DIAGNOSIS — H35352 Cystoid macular degeneration, left eye: Secondary | ICD-10-CM | POA: Diagnosis not present

## 2020-08-10 DIAGNOSIS — H40052 Ocular hypertension, left eye: Secondary | ICD-10-CM | POA: Diagnosis not present

## 2020-08-10 DIAGNOSIS — Z79899 Other long term (current) drug therapy: Secondary | ICD-10-CM | POA: Diagnosis not present

## 2020-08-10 DIAGNOSIS — H27112 Subluxation of lens, left eye: Secondary | ICD-10-CM | POA: Diagnosis not present

## 2020-08-10 DIAGNOSIS — H353132 Nonexudative age-related macular degeneration, bilateral, intermediate dry stage: Secondary | ICD-10-CM | POA: Diagnosis not present

## 2020-08-10 DIAGNOSIS — H35352 Cystoid macular degeneration, left eye: Secondary | ICD-10-CM | POA: Diagnosis not present

## 2020-08-10 DIAGNOSIS — H04122 Dry eye syndrome of left lacrimal gland: Secondary | ICD-10-CM | POA: Diagnosis not present

## 2020-09-13 DIAGNOSIS — H40052 Ocular hypertension, left eye: Secondary | ICD-10-CM | POA: Diagnosis not present

## 2020-09-13 DIAGNOSIS — H04123 Dry eye syndrome of bilateral lacrimal glands: Secondary | ICD-10-CM | POA: Diagnosis not present

## 2020-09-13 DIAGNOSIS — H27112 Subluxation of lens, left eye: Secondary | ICD-10-CM | POA: Diagnosis not present

## 2020-09-13 DIAGNOSIS — Z79899 Other long term (current) drug therapy: Secondary | ICD-10-CM | POA: Diagnosis not present

## 2020-09-13 DIAGNOSIS — H35352 Cystoid macular degeneration, left eye: Secondary | ICD-10-CM | POA: Diagnosis not present

## 2020-09-13 DIAGNOSIS — H353132 Nonexudative age-related macular degeneration, bilateral, intermediate dry stage: Secondary | ICD-10-CM | POA: Diagnosis not present

## 2020-10-18 DIAGNOSIS — H4032X3 Glaucoma secondary to eye trauma, left eye, severe stage: Secondary | ICD-10-CM | POA: Diagnosis not present

## 2020-10-18 DIAGNOSIS — H02402 Unspecified ptosis of left eyelid: Secondary | ICD-10-CM | POA: Diagnosis not present

## 2020-10-18 DIAGNOSIS — H04123 Dry eye syndrome of bilateral lacrimal glands: Secondary | ICD-10-CM | POA: Diagnosis not present

## 2020-11-02 DIAGNOSIS — H35352 Cystoid macular degeneration, left eye: Secondary | ICD-10-CM | POA: Diagnosis not present

## 2020-11-02 DIAGNOSIS — H27112 Subluxation of lens, left eye: Secondary | ICD-10-CM | POA: Diagnosis not present

## 2020-11-02 DIAGNOSIS — H04123 Dry eye syndrome of bilateral lacrimal glands: Secondary | ICD-10-CM | POA: Diagnosis not present

## 2020-11-02 DIAGNOSIS — H353132 Nonexudative age-related macular degeneration, bilateral, intermediate dry stage: Secondary | ICD-10-CM | POA: Diagnosis not present

## 2020-11-02 DIAGNOSIS — Z79899 Other long term (current) drug therapy: Secondary | ICD-10-CM | POA: Diagnosis not present

## 2020-11-02 DIAGNOSIS — H4032X3 Glaucoma secondary to eye trauma, left eye, severe stage: Secondary | ICD-10-CM | POA: Diagnosis not present

## 2021-01-16 DIAGNOSIS — D485 Neoplasm of uncertain behavior of skin: Secondary | ICD-10-CM | POA: Diagnosis not present

## 2021-01-25 DIAGNOSIS — H35352 Cystoid macular degeneration, left eye: Secondary | ICD-10-CM | POA: Diagnosis not present

## 2021-01-25 DIAGNOSIS — Z79899 Other long term (current) drug therapy: Secondary | ICD-10-CM | POA: Diagnosis not present

## 2021-01-25 DIAGNOSIS — H353132 Nonexudative age-related macular degeneration, bilateral, intermediate dry stage: Secondary | ICD-10-CM | POA: Diagnosis not present

## 2021-01-25 DIAGNOSIS — H4032X3 Glaucoma secondary to eye trauma, left eye, severe stage: Secondary | ICD-10-CM | POA: Diagnosis not present

## 2021-01-25 DIAGNOSIS — H04122 Dry eye syndrome of left lacrimal gland: Secondary | ICD-10-CM | POA: Diagnosis not present

## 2021-01-25 DIAGNOSIS — H27112 Subluxation of lens, left eye: Secondary | ICD-10-CM | POA: Diagnosis not present

## 2021-03-09 DIAGNOSIS — H4032X3 Glaucoma secondary to eye trauma, left eye, severe stage: Secondary | ICD-10-CM | POA: Diagnosis not present

## 2021-03-09 DIAGNOSIS — H04123 Dry eye syndrome of bilateral lacrimal glands: Secondary | ICD-10-CM | POA: Diagnosis not present

## 2021-03-09 DIAGNOSIS — H02401 Unspecified ptosis of right eyelid: Secondary | ICD-10-CM | POA: Diagnosis not present

## 2021-03-09 DIAGNOSIS — Z79899 Other long term (current) drug therapy: Secondary | ICD-10-CM | POA: Diagnosis not present

## 2021-04-13 DIAGNOSIS — Z79899 Other long term (current) drug therapy: Secondary | ICD-10-CM | POA: Diagnosis not present

## 2021-04-13 DIAGNOSIS — H02402 Unspecified ptosis of left eyelid: Secondary | ICD-10-CM | POA: Diagnosis not present

## 2021-04-13 DIAGNOSIS — H04123 Dry eye syndrome of bilateral lacrimal glands: Secondary | ICD-10-CM | POA: Diagnosis not present

## 2021-04-13 DIAGNOSIS — H4032X3 Glaucoma secondary to eye trauma, left eye, severe stage: Secondary | ICD-10-CM | POA: Diagnosis not present

## 2021-05-29 DIAGNOSIS — H4032X3 Glaucoma secondary to eye trauma, left eye, severe stage: Secondary | ICD-10-CM | POA: Diagnosis not present

## 2021-05-29 DIAGNOSIS — H04123 Dry eye syndrome of bilateral lacrimal glands: Secondary | ICD-10-CM | POA: Diagnosis not present

## 2021-05-29 DIAGNOSIS — H02402 Unspecified ptosis of left eyelid: Secondary | ICD-10-CM | POA: Diagnosis not present

## 2021-05-29 DIAGNOSIS — Z79899 Other long term (current) drug therapy: Secondary | ICD-10-CM | POA: Diagnosis not present

## 2021-05-31 DIAGNOSIS — H04123 Dry eye syndrome of bilateral lacrimal glands: Secondary | ICD-10-CM | POA: Diagnosis not present

## 2021-05-31 DIAGNOSIS — H353132 Nonexudative age-related macular degeneration, bilateral, intermediate dry stage: Secondary | ICD-10-CM | POA: Diagnosis not present

## 2021-05-31 DIAGNOSIS — Z79899 Other long term (current) drug therapy: Secondary | ICD-10-CM | POA: Diagnosis not present

## 2021-05-31 DIAGNOSIS — H4032X3 Glaucoma secondary to eye trauma, left eye, severe stage: Secondary | ICD-10-CM | POA: Diagnosis not present

## 2021-05-31 DIAGNOSIS — H27112 Subluxation of lens, left eye: Secondary | ICD-10-CM | POA: Diagnosis not present

## 2021-05-31 DIAGNOSIS — H04122 Dry eye syndrome of left lacrimal gland: Secondary | ICD-10-CM | POA: Diagnosis not present

## 2021-05-31 DIAGNOSIS — H35352 Cystoid macular degeneration, left eye: Secondary | ICD-10-CM | POA: Diagnosis not present

## 2021-09-13 DIAGNOSIS — H04123 Dry eye syndrome of bilateral lacrimal glands: Secondary | ICD-10-CM | POA: Diagnosis not present

## 2021-09-13 DIAGNOSIS — H27112 Subluxation of lens, left eye: Secondary | ICD-10-CM | POA: Diagnosis not present

## 2021-09-13 DIAGNOSIS — Z79899 Other long term (current) drug therapy: Secondary | ICD-10-CM | POA: Diagnosis not present

## 2021-09-13 DIAGNOSIS — H35352 Cystoid macular degeneration, left eye: Secondary | ICD-10-CM | POA: Diagnosis not present

## 2021-09-13 DIAGNOSIS — H353132 Nonexudative age-related macular degeneration, bilateral, intermediate dry stage: Secondary | ICD-10-CM | POA: Diagnosis not present

## 2021-09-13 DIAGNOSIS — H4032X3 Glaucoma secondary to eye trauma, left eye, severe stage: Secondary | ICD-10-CM | POA: Diagnosis not present

## 2021-11-07 DIAGNOSIS — H04123 Dry eye syndrome of bilateral lacrimal glands: Secondary | ICD-10-CM | POA: Diagnosis not present

## 2021-11-07 DIAGNOSIS — H4032X3 Glaucoma secondary to eye trauma, left eye, severe stage: Secondary | ICD-10-CM | POA: Diagnosis not present

## 2021-11-07 DIAGNOSIS — H1013 Acute atopic conjunctivitis, bilateral: Secondary | ICD-10-CM | POA: Diagnosis not present

## 2021-11-07 DIAGNOSIS — H02402 Unspecified ptosis of left eyelid: Secondary | ICD-10-CM | POA: Diagnosis not present

## 2021-11-07 DIAGNOSIS — Z79899 Other long term (current) drug therapy: Secondary | ICD-10-CM | POA: Diagnosis not present

## 2022-01-10 DIAGNOSIS — H4032X3 Glaucoma secondary to eye trauma, left eye, severe stage: Secondary | ICD-10-CM | POA: Diagnosis not present

## 2022-01-10 DIAGNOSIS — Z79899 Other long term (current) drug therapy: Secondary | ICD-10-CM | POA: Diagnosis not present

## 2022-01-10 DIAGNOSIS — H35352 Cystoid macular degeneration, left eye: Secondary | ICD-10-CM | POA: Diagnosis not present

## 2022-01-10 DIAGNOSIS — H27112 Subluxation of lens, left eye: Secondary | ICD-10-CM | POA: Diagnosis not present

## 2022-01-10 DIAGNOSIS — H04123 Dry eye syndrome of bilateral lacrimal glands: Secondary | ICD-10-CM | POA: Diagnosis not present

## 2022-01-10 DIAGNOSIS — H353132 Nonexudative age-related macular degeneration, bilateral, intermediate dry stage: Secondary | ICD-10-CM | POA: Diagnosis not present

## 2022-05-09 DIAGNOSIS — Z8669 Personal history of other diseases of the nervous system and sense organs: Secondary | ICD-10-CM | POA: Diagnosis not present

## 2022-05-09 DIAGNOSIS — H27112 Subluxation of lens, left eye: Secondary | ICD-10-CM | POA: Diagnosis not present

## 2022-05-09 DIAGNOSIS — H4032X3 Glaucoma secondary to eye trauma, left eye, severe stage: Secondary | ICD-10-CM | POA: Diagnosis not present

## 2022-05-09 DIAGNOSIS — H04123 Dry eye syndrome of bilateral lacrimal glands: Secondary | ICD-10-CM | POA: Diagnosis not present

## 2022-05-09 DIAGNOSIS — H353132 Nonexudative age-related macular degeneration, bilateral, intermediate dry stage: Secondary | ICD-10-CM | POA: Diagnosis not present

## 2022-05-09 DIAGNOSIS — H35352 Cystoid macular degeneration, left eye: Secondary | ICD-10-CM | POA: Diagnosis not present

## 2022-08-08 DIAGNOSIS — H353132 Nonexudative age-related macular degeneration, bilateral, intermediate dry stage: Secondary | ICD-10-CM | POA: Diagnosis not present

## 2022-08-08 DIAGNOSIS — H2511 Age-related nuclear cataract, right eye: Secondary | ICD-10-CM | POA: Diagnosis not present

## 2022-08-08 DIAGNOSIS — H16143 Punctate keratitis, bilateral: Secondary | ICD-10-CM | POA: Diagnosis not present

## 2022-08-08 DIAGNOSIS — H4032X3 Glaucoma secondary to eye trauma, left eye, severe stage: Secondary | ICD-10-CM | POA: Diagnosis not present

## 2022-08-08 DIAGNOSIS — H57812 Brow ptosis, left: Secondary | ICD-10-CM | POA: Diagnosis not present

## 2022-08-08 DIAGNOSIS — Z888 Allergy status to other drugs, medicaments and biological substances status: Secondary | ICD-10-CM | POA: Diagnosis not present

## 2022-08-08 DIAGNOSIS — H04123 Dry eye syndrome of bilateral lacrimal glands: Secondary | ICD-10-CM | POA: Diagnosis not present

## 2022-08-08 DIAGNOSIS — H27112 Subluxation of lens, left eye: Secondary | ICD-10-CM | POA: Diagnosis not present

## 2022-08-08 DIAGNOSIS — H43811 Vitreous degeneration, right eye: Secondary | ICD-10-CM | POA: Diagnosis not present

## 2022-08-08 DIAGNOSIS — H35352 Cystoid macular degeneration, left eye: Secondary | ICD-10-CM | POA: Diagnosis not present

## 2022-11-23 DIAGNOSIS — H02402 Unspecified ptosis of left eyelid: Secondary | ICD-10-CM | POA: Diagnosis not present

## 2022-11-23 DIAGNOSIS — H2511 Age-related nuclear cataract, right eye: Secondary | ICD-10-CM | POA: Diagnosis not present

## 2022-11-23 DIAGNOSIS — H4032X3 Glaucoma secondary to eye trauma, left eye, severe stage: Secondary | ICD-10-CM | POA: Diagnosis not present

## 2022-11-23 DIAGNOSIS — H11821 Conjunctivochalasis, right eye: Secondary | ICD-10-CM | POA: Diagnosis not present

## 2022-11-23 DIAGNOSIS — Z91041 Radiographic dye allergy status: Secondary | ICD-10-CM | POA: Diagnosis not present

## 2022-11-23 DIAGNOSIS — H43811 Vitreous degeneration, right eye: Secondary | ICD-10-CM | POA: Diagnosis not present

## 2022-11-23 DIAGNOSIS — H1013 Acute atopic conjunctivitis, bilateral: Secondary | ICD-10-CM | POA: Diagnosis not present

## 2022-11-23 DIAGNOSIS — H04123 Dry eye syndrome of bilateral lacrimal glands: Secondary | ICD-10-CM | POA: Diagnosis not present

## 2022-11-23 DIAGNOSIS — H16143 Punctate keratitis, bilateral: Secondary | ICD-10-CM | POA: Diagnosis not present

## 2023-02-13 DIAGNOSIS — H04123 Dry eye syndrome of bilateral lacrimal glands: Secondary | ICD-10-CM | POA: Diagnosis not present

## 2023-02-13 DIAGNOSIS — H35352 Cystoid macular degeneration, left eye: Secondary | ICD-10-CM | POA: Diagnosis not present

## 2023-02-13 DIAGNOSIS — H2511 Age-related nuclear cataract, right eye: Secondary | ICD-10-CM | POA: Diagnosis not present

## 2023-02-13 DIAGNOSIS — H27112 Subluxation of lens, left eye: Secondary | ICD-10-CM | POA: Diagnosis not present

## 2023-02-13 DIAGNOSIS — H4032X3 Glaucoma secondary to eye trauma, left eye, severe stage: Secondary | ICD-10-CM | POA: Diagnosis not present

## 2023-02-13 DIAGNOSIS — H353132 Nonexudative age-related macular degeneration, bilateral, intermediate dry stage: Secondary | ICD-10-CM | POA: Diagnosis not present

## 2023-08-14 DIAGNOSIS — H35352 Cystoid macular degeneration, left eye: Secondary | ICD-10-CM | POA: Diagnosis not present

## 2023-08-14 DIAGNOSIS — H27112 Subluxation of lens, left eye: Secondary | ICD-10-CM | POA: Diagnosis not present

## 2023-08-14 DIAGNOSIS — H4032X3 Glaucoma secondary to eye trauma, left eye, severe stage: Secondary | ICD-10-CM | POA: Diagnosis not present

## 2023-08-14 DIAGNOSIS — H04123 Dry eye syndrome of bilateral lacrimal glands: Secondary | ICD-10-CM | POA: Diagnosis not present

## 2023-08-14 DIAGNOSIS — H353132 Nonexudative age-related macular degeneration, bilateral, intermediate dry stage: Secondary | ICD-10-CM | POA: Diagnosis not present

## 2023-11-12 DIAGNOSIS — H02402 Unspecified ptosis of left eyelid: Secondary | ICD-10-CM | POA: Diagnosis not present

## 2023-11-12 DIAGNOSIS — H1013 Acute atopic conjunctivitis, bilateral: Secondary | ICD-10-CM | POA: Diagnosis not present

## 2023-11-12 DIAGNOSIS — H4032X3 Glaucoma secondary to eye trauma, left eye, severe stage: Secondary | ICD-10-CM | POA: Diagnosis not present

## 2023-11-12 DIAGNOSIS — H04123 Dry eye syndrome of bilateral lacrimal glands: Secondary | ICD-10-CM | POA: Diagnosis not present

## 2023-11-19 DIAGNOSIS — B029 Zoster without complications: Secondary | ICD-10-CM | POA: Diagnosis not present

## 2023-11-19 DIAGNOSIS — Z6821 Body mass index (BMI) 21.0-21.9, adult: Secondary | ICD-10-CM | POA: Diagnosis not present

## 2023-12-25 DIAGNOSIS — H35352 Cystoid macular degeneration, left eye: Secondary | ICD-10-CM | POA: Diagnosis not present

## 2023-12-25 DIAGNOSIS — H4032X3 Glaucoma secondary to eye trauma, left eye, severe stage: Secondary | ICD-10-CM | POA: Diagnosis not present

## 2023-12-25 DIAGNOSIS — H04123 Dry eye syndrome of bilateral lacrimal glands: Secondary | ICD-10-CM | POA: Diagnosis not present

## 2023-12-25 DIAGNOSIS — H27112 Subluxation of lens, left eye: Secondary | ICD-10-CM | POA: Diagnosis not present

## 2023-12-25 DIAGNOSIS — H353132 Nonexudative age-related macular degeneration, bilateral, intermediate dry stage: Secondary | ICD-10-CM | POA: Diagnosis not present

## 2024-04-06 DIAGNOSIS — H04123 Dry eye syndrome of bilateral lacrimal glands: Secondary | ICD-10-CM | POA: Diagnosis not present

## 2024-04-06 DIAGNOSIS — H27112 Subluxation of lens, left eye: Secondary | ICD-10-CM | POA: Diagnosis not present

## 2024-04-06 DIAGNOSIS — H4032X3 Glaucoma secondary to eye trauma, left eye, severe stage: Secondary | ICD-10-CM | POA: Diagnosis not present

## 2024-04-06 DIAGNOSIS — H353132 Nonexudative age-related macular degeneration, bilateral, intermediate dry stage: Secondary | ICD-10-CM | POA: Diagnosis not present

## 2024-04-06 DIAGNOSIS — H2511 Age-related nuclear cataract, right eye: Secondary | ICD-10-CM | POA: Diagnosis not present

## 2024-06-04 DIAGNOSIS — H52221 Regular astigmatism, right eye: Secondary | ICD-10-CM | POA: Diagnosis not present

## 2024-06-04 DIAGNOSIS — H2511 Age-related nuclear cataract, right eye: Secondary | ICD-10-CM | POA: Diagnosis not present

## 2024-06-04 DIAGNOSIS — R030 Elevated blood-pressure reading, without diagnosis of hypertension: Secondary | ICD-10-CM | POA: Diagnosis not present

## 2024-06-10 DIAGNOSIS — H269 Unspecified cataract: Secondary | ICD-10-CM | POA: Diagnosis not present

## 2024-06-10 DIAGNOSIS — H2511 Age-related nuclear cataract, right eye: Secondary | ICD-10-CM | POA: Diagnosis not present

## 2024-06-11 DIAGNOSIS — H4032X3 Glaucoma secondary to eye trauma, left eye, severe stage: Secondary | ICD-10-CM | POA: Diagnosis not present

## 2024-06-11 DIAGNOSIS — Z961 Presence of intraocular lens: Secondary | ICD-10-CM | POA: Diagnosis not present

## 2024-06-11 DIAGNOSIS — Z9841 Cataract extraction status, right eye: Secondary | ICD-10-CM | POA: Diagnosis not present

## 2024-06-11 DIAGNOSIS — Z9889 Other specified postprocedural states: Secondary | ICD-10-CM | POA: Diagnosis not present

## 2024-06-11 DIAGNOSIS — H04123 Dry eye syndrome of bilateral lacrimal glands: Secondary | ICD-10-CM | POA: Diagnosis not present

## 2024-06-11 DIAGNOSIS — H353132 Nonexudative age-related macular degeneration, bilateral, intermediate dry stage: Secondary | ICD-10-CM | POA: Diagnosis not present

## 2024-06-22 DIAGNOSIS — Z9889 Other specified postprocedural states: Secondary | ICD-10-CM | POA: Diagnosis not present

## 2024-06-22 DIAGNOSIS — Z9841 Cataract extraction status, right eye: Secondary | ICD-10-CM | POA: Diagnosis not present

## 2024-06-22 DIAGNOSIS — Z961 Presence of intraocular lens: Secondary | ICD-10-CM | POA: Diagnosis not present

## 2024-07-20 DIAGNOSIS — H4032X3 Glaucoma secondary to eye trauma, left eye, severe stage: Secondary | ICD-10-CM | POA: Diagnosis not present

## 2024-07-20 DIAGNOSIS — H35352 Cystoid macular degeneration, left eye: Secondary | ICD-10-CM | POA: Diagnosis not present

## 2024-07-20 DIAGNOSIS — H27112 Subluxation of lens, left eye: Secondary | ICD-10-CM | POA: Diagnosis not present

## 2024-07-20 DIAGNOSIS — Z961 Presence of intraocular lens: Secondary | ICD-10-CM | POA: Diagnosis not present

## 2024-07-20 DIAGNOSIS — H04123 Dry eye syndrome of bilateral lacrimal glands: Secondary | ICD-10-CM | POA: Diagnosis not present

## 2024-07-20 DIAGNOSIS — H353132 Nonexudative age-related macular degeneration, bilateral, intermediate dry stage: Secondary | ICD-10-CM | POA: Diagnosis not present

## 2024-08-21 DIAGNOSIS — H27112 Subluxation of lens, left eye: Secondary | ICD-10-CM | POA: Diagnosis not present

## 2024-08-21 DIAGNOSIS — Z961 Presence of intraocular lens: Secondary | ICD-10-CM | POA: Diagnosis not present

## 2024-08-21 DIAGNOSIS — Z48810 Encounter for surgical aftercare following surgery on the sense organs: Secondary | ICD-10-CM | POA: Diagnosis not present

## 2024-08-21 DIAGNOSIS — H04123 Dry eye syndrome of bilateral lacrimal glands: Secondary | ICD-10-CM | POA: Diagnosis not present

## 2024-08-21 DIAGNOSIS — H4032X3 Glaucoma secondary to eye trauma, left eye, severe stage: Secondary | ICD-10-CM | POA: Diagnosis not present

## 2024-08-21 DIAGNOSIS — H35352 Cystoid macular degeneration, left eye: Secondary | ICD-10-CM | POA: Diagnosis not present

## 2024-08-21 DIAGNOSIS — H353132 Nonexudative age-related macular degeneration, bilateral, intermediate dry stage: Secondary | ICD-10-CM | POA: Diagnosis not present
